# Patient Record
Sex: Female | Born: 1960 | Race: White | Hispanic: No | Smoking: Current every day smoker
Health system: Southern US, Community
[De-identification: ages and names within clinical notes are randomized; demographics above are authoritative.]

## PROBLEM LIST (undated history)

## (undated) DIAGNOSIS — C801 Malignant (primary) neoplasm, unspecified: Secondary | ICD-10-CM

## (undated) DIAGNOSIS — M797 Fibromyalgia: Secondary | ICD-10-CM

## (undated) DIAGNOSIS — F419 Anxiety disorder, unspecified: Secondary | ICD-10-CM

## (undated) DIAGNOSIS — M359 Systemic involvement of connective tissue, unspecified: Secondary | ICD-10-CM

## (undated) DIAGNOSIS — I671 Cerebral aneurysm, nonruptured: Secondary | ICD-10-CM

## (undated) DIAGNOSIS — G8929 Other chronic pain: Secondary | ICD-10-CM

## (undated) DIAGNOSIS — J439 Emphysema, unspecified: Secondary | ICD-10-CM

## (undated) DIAGNOSIS — M549 Dorsalgia, unspecified: Secondary | ICD-10-CM

## (undated) DIAGNOSIS — E785 Hyperlipidemia, unspecified: Secondary | ICD-10-CM

## (undated) DIAGNOSIS — J449 Chronic obstructive pulmonary disease, unspecified: Secondary | ICD-10-CM

## (undated) HISTORY — PX: OOPHORECTOMY: SHX86

## (undated) HISTORY — PX: BACK SURGERY: SHX140

## (undated) HISTORY — PX: APPENDECTOMY: SHX54

## (undated) HISTORY — PX: BRAIN SURGERY: SHX531

## (undated) HISTORY — PX: CARPAL TUNNEL RELEASE: SHX101

## (undated) HISTORY — PX: ABDOMINAL HYSTERECTOMY: SHX81

## (undated) HISTORY — PX: CHOLECYSTECTOMY: SHX55

---

## 2009-07-28 ENCOUNTER — Emergency Department (HOSPITAL_COMMUNITY): Admission: EM | Admit: 2009-07-28 | Discharge: 2009-07-29 | Payer: Self-pay | Admitting: Emergency Medicine

## 2009-12-29 ENCOUNTER — Emergency Department (HOSPITAL_COMMUNITY): Admission: EM | Admit: 2009-12-29 | Discharge: 2009-12-30 | Payer: Self-pay | Admitting: Emergency Medicine

## 2009-12-29 ENCOUNTER — Encounter: Payer: Self-pay | Admitting: Emergency Medicine

## 2010-01-11 ENCOUNTER — Ambulatory Visit (HOSPITAL_COMMUNITY)
Admission: RE | Admit: 2010-01-11 | Discharge: 2010-01-11 | Payer: Self-pay | Source: Home / Self Care | Admitting: Neurosurgery

## 2010-01-26 ENCOUNTER — Inpatient Hospital Stay (HOSPITAL_COMMUNITY): Admission: RE | Admit: 2010-01-26 | Discharge: 2010-01-29 | Payer: Self-pay | Admitting: Neurosurgery

## 2010-05-28 ENCOUNTER — Emergency Department (HOSPITAL_COMMUNITY): Payer: Medicare Other

## 2010-05-28 ENCOUNTER — Observation Stay (HOSPITAL_COMMUNITY)
Admission: RE | Admit: 2010-05-28 | Discharge: 2010-05-29 | Disposition: A | Discharge status: Home / Self Care | Payer: Medicare Other | Source: Ambulatory Visit | Attending: Family Medicine | Admitting: Family Medicine

## 2010-05-28 DIAGNOSIS — E039 Hypothyroidism, unspecified: Secondary | ICD-10-CM | POA: Insufficient documentation

## 2010-05-28 DIAGNOSIS — R1011 Right upper quadrant pain: Secondary | ICD-10-CM | POA: Insufficient documentation

## 2010-05-28 DIAGNOSIS — R0602 Shortness of breath: Secondary | ICD-10-CM | POA: Insufficient documentation

## 2010-05-28 DIAGNOSIS — R0789 Other chest pain: Principal | ICD-10-CM | POA: Insufficient documentation

## 2010-05-28 DIAGNOSIS — E785 Hyperlipidemia, unspecified: Secondary | ICD-10-CM | POA: Insufficient documentation

## 2010-05-28 DIAGNOSIS — IMO0001 Reserved for inherently not codable concepts without codable children: Secondary | ICD-10-CM | POA: Insufficient documentation

## 2010-05-28 DIAGNOSIS — R000 Tachycardia, unspecified: Secondary | ICD-10-CM | POA: Insufficient documentation

## 2010-05-28 DIAGNOSIS — Z79899 Other long term (current) drug therapy: Secondary | ICD-10-CM | POA: Insufficient documentation

## 2010-05-28 LAB — CBC
Hemoglobin: 13.9 g/dL (ref 12.0–15.0)
Platelets: 238 10*3/uL (ref 150–400)
RBC: 4.56 MIL/uL (ref 3.87–5.11)
WBC: 9.1 10*3/uL (ref 4.0–10.5)

## 2010-05-28 LAB — URINALYSIS, ROUTINE W REFLEX MICROSCOPIC
Hgb urine dipstick: NEGATIVE
Nitrite: NEGATIVE
Protein, ur: NEGATIVE mg/dL
Specific Gravity, Urine: 1.015 (ref 1.005–1.030)
Urobilinogen, UA: 0.2 mg/dL (ref 0.0–1.0)

## 2010-05-28 LAB — COMPREHENSIVE METABOLIC PANEL
ALT: 33 U/L (ref 0–35)
AST: 26 U/L (ref 0–37)
CO2: 27 mEq/L (ref 19–32)
Calcium: 9.6 mg/dL (ref 8.4–10.5)
Creatinine, Ser: 0.77 mg/dL (ref 0.4–1.2)
GFR calc Af Amer: >60 mL/min (ref >60)
GFR calc non Af Amer: >60 mL/min (ref >60)
Sodium: 140 mEq/L (ref 135–145)
Total Protein: 7.9 g/dL (ref 6.0–8.3)

## 2010-05-28 LAB — DIFFERENTIAL
Basophils Absolute: 0.1 10*3/uL (ref 0.0–0.1)
Basophils Relative: 1 % (ref 0–1)
Eosinophils Absolute: 0.9 10*3/uL — ABNORMAL HIGH (ref 0.0–0.7)
Neutro Abs: 4.5 10*3/uL (ref 1.7–7.7)
Neutrophils Relative %: 49 % (ref 43–77)

## 2010-05-28 LAB — CK TOTAL AND CKMB (NOT AT ARMC)
Relative Index: INVALID (ref 0.0–2.5)
Total CK: 71 U/L (ref 7–177)

## 2010-05-28 LAB — LIPASE, BLOOD: Lipase: 25 U/L (ref 11–59)

## 2010-05-28 MED ORDER — IOHEXOL 300 MG/ML  SOLN
100.0000 mL | Freq: Once | INTRAMUSCULAR | Status: AC | PRN
  Administered 2010-05-28: 100 mL via INTRAVENOUS

## 2010-05-29 DIAGNOSIS — R072 Precordial pain: Secondary | ICD-10-CM

## 2010-05-29 LAB — CBC
HCT: 39.9 % (ref 36.0–46.0)
Hemoglobin: 13.1 g/dL (ref 12.0–15.0)
MCH: 30.1 pg (ref 26.0–34.0)
MCHC: 32.8 g/dL (ref 30.0–36.0)
MCV: 91.7 fL (ref 78.0–100.0)
RDW: 15.2 % (ref 11.5–15.5)

## 2010-05-29 LAB — DIFFERENTIAL
Eosinophils Relative: 13 % — ABNORMAL HIGH (ref 0–5)
Lymphocytes Relative: 33 % (ref 12–46)
Lymphs Abs: 2.7 10*3/uL (ref 0.7–4.0)
Monocytes Absolute: 0.7 10*3/uL (ref 0.1–1.0)
Monocytes Relative: 9 % (ref 3–12)
Neutro Abs: 3.6 10*3/uL (ref 1.7–7.7)

## 2010-05-29 LAB — COMPREHENSIVE METABOLIC PANEL
AST: 21 U/L (ref 0–37)
Albumin: 3.8 g/dL (ref 3.5–5.2)
BUN: 7 mg/dL (ref 6–23)
Chloride: 104 mEq/L (ref 96–112)
Creatinine, Ser: 0.7 mg/dL (ref 0.4–1.2)
GFR calc Af Amer: >60 mL/min (ref >60)
Total Protein: 6.8 g/dL (ref 6.0–8.3)

## 2010-05-29 LAB — CARDIAC PANEL(CRET KIN+CKTOT+MB+TROPI)
CK, MB: 1.2 ng/mL (ref 0.3–4.0)
Relative Index: INVALID (ref 0.0–2.5)
Total CK: 65 U/L (ref 7–177)
Troponin I: 0.01 ng/mL (ref 0.00–0.06)

## 2010-05-29 LAB — TSH: TSH: 2.325 u[IU]/mL (ref 0.350–4.500)

## 2010-05-29 LAB — LIPID PANEL
HDL: 19 mg/dL — ABNORMAL LOW (ref >39)
Triglycerides: 443 mg/dL — ABNORMAL HIGH (ref <150)
VLDL: UNDETERMINED mg/dL (ref 0–40)

## 2010-05-29 LAB — BRAIN NATRIURETIC PEPTIDE: Pro B Natriuretic peptide (BNP): <30 pg/mL (ref 0.0–100.0)

## 2010-05-29 LAB — D-DIMER, QUANTITATIVE: D-Dimer, Quant: 0.34 ug/mL-FEU (ref 0.00–0.48)

## 2010-05-30 ENCOUNTER — Encounter (HOSPITAL_COMMUNITY): Payer: Self-pay

## 2010-05-30 ENCOUNTER — Encounter (HOSPITAL_COMMUNITY)
Admit: 2010-05-30 | Discharge: 2010-05-30 | Disposition: A | Discharge status: Home / Self Care | Payer: Medicare Other | Source: Ambulatory Visit | Attending: Family Medicine | Admitting: Family Medicine

## 2010-05-30 DIAGNOSIS — R932 Abnormal findings on diagnostic imaging of liver and biliary tract: Secondary | ICD-10-CM | POA: Insufficient documentation

## 2010-05-30 DIAGNOSIS — R109 Unspecified abdominal pain: Secondary | ICD-10-CM | POA: Insufficient documentation

## 2010-05-30 HISTORY — DX: Malignant (primary) neoplasm, unspecified: C80.1

## 2010-05-30 HISTORY — DX: Systemic involvement of connective tissue, unspecified: M35.9

## 2010-05-30 MED ORDER — TECHNETIUM TC 99M MEBROFENIN IV KIT
5.0000 | PACK | Freq: Once | INTRAVENOUS | Status: AC | PRN
  Administered 2010-05-30: 5 via INTRAVENOUS

## 2010-06-01 NOTE — Discharge Summary (Signed)
  NAMECLARINDA, OBI              ACCOUNT NO.:  [id number]  MEDICAL RECORD NO.:  [id number]           PATIENT TYPE:  I  LOCATION:  A303                          FACILITY:  [facility]  PHYSICIAN:  Tarry Kos, MD       DATE OF BIRTH:  [birth year]  DATE OF ADMISSION:  05/28/2010 DATE OF DISCHARGE:  02/28/2012LH                              DISCHARGE SUMMARY   DISCHARGE DIAGNOSES: 1. Atypical chest pain. 2. Right upper quadrant abdominal pain.  SUMMARY HOSPITAL COURSE:  Ms. Kirsten Montgomery is a 50 year old female who presented to the emergency room last night with atypical chest pain/right upper quadrant abdominal pain.  She was admitted for ACS. She had serial cardiac enzymes which were all negative.  Her LFTs were normal.  Her total bili was normal.  Alk phos was also normal.  Cardiac enzymes were all negative.  She had a CT of her abdomen and pelvis which showed diffuse hepatic steatosis, 3.7 cm cystic lesion in the left adnexa.  I recommend followup ultrasound in 6 weeks.  She will need to have this followed up with her gynecologist or primary care physician. I have scheduled for her to get a HIDA scan, it sounds like she has been having biliary colic for several months now.  I am going to schedule HIDA scan, it is going to be done tomorrow. This will need to be sent to her primary care physician, Dr. Loney Hering.  She had a 2-D echo done today also that final report is pending.  She is being discharged home to continue her home medication regimen, and the only thing that I have added is aspirin at follow up probably with her primary care physician in 1 week.  I would recommend following up on the HIDA scan and a 2-D echo that was done and further outpatient cardiac stress testing if warranted.  Please see admit H and P for full details.                                           ______________________________ Tarry Kos, MD     RD/MEDQ  D:  05/29/2010  T:  05/30/2010  Job:   [id number]  Electronically Signed by Eldridge Dace MD on 06/01/2010 02:10:57 PM

## 2010-06-08 NOTE — Discharge Summary (Signed)
  NAMEHONG, TIMM              ACCOUNT NO.:  [id number]  MEDICAL RECORD NO.:  [id number]          PATIENT TYPE:  INP  LOCATION:  3008                         FACILITY:  [facility]  PHYSICIAN:  Danae Orleans. [name], M.D.  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  HISTORY OF ILLNESS AND HOSPITAL COURSE:  Kirsten Montgomery was found to have an incidental left 8-mm middle cerebral artery aneurysm.  It was elected to admit her to the hospital on the same day's procedure basis for her to undergo craniotomy and clipping of aneurysm.  The patient did well with this surgery, was observed in the ICU, had no complications, was doing well on January 30, 2011, and was discharged home with instructions to follow up in 10 days for staple removal.  Discharge medications included Percocet.  Final diagnosis was same and condition was improved.  Her discharge medications include: 1. Diazepam 10 mg 3 times daily and nightly, these are preoperative     medications. 2. Additionally, tramadol 50 mg 2 tablets b.i.d. and nightly p.r.n. 3. Lorazepam 1 mg p.o. q.i.d. 4. Gabapentin 600 mg 3 times daily. 5. Doxycycline 100 mg twice daily. 6. Lipitor 20 mg daily. 7. Liothyronine 5 mcg 2 tablets every evening. 8. Cyclobenzaprine 10 mg 2 tablets every evening. 9. Amitriptyline 50 mg nightly. 10.Zolpidem 10 mg nightly.  Additional diagnoses include: 1. Anxiety disorder. 2. Chronic back pain. 3. Sleep disorder.     Danae Orleans. [name], M.D.     JDS/MEDQ  D:  05/24/2010  T:  05/24/2010  Job:  [id number]  Electronically Signed by Maeola Harman M.D. on 06/08/2010 12:13:30 PM

## 2010-06-13 LAB — BASIC METABOLIC PANEL
BUN: 7 mg/dL (ref 6–23)
Calcium: 9.8 mg/dL (ref 8.4–10.5)
GFR calc Af Amer: >60 mL/min (ref >60)
Potassium: 4.8 mEq/L (ref 3.5–5.1)

## 2010-06-13 LAB — TYPE AND SCREEN: Unit division: 0

## 2010-06-13 LAB — CBC
HCT: 47.7 % — ABNORMAL HIGH (ref 36.0–46.0)
Hemoglobin: 16.4 g/dL — ABNORMAL HIGH (ref 12.0–15.0)
MCH: 30.3 pg (ref 26.0–34.0)
MCHC: 34.4 g/dL (ref 30.0–36.0)
MCV: 88.2 fL (ref 78.0–100.0)

## 2010-06-13 LAB — SURGICAL PCR SCREEN: Staphylococcus aureus: NEGATIVE

## 2010-06-14 LAB — BASIC METABOLIC PANEL
BUN: 7 mg/dL (ref 6–23)
CO2: 31 mEq/L (ref 19–32)
Calcium: 9.4 mg/dL (ref 8.4–10.5)
Calcium: 9 mg/dL (ref 8.4–10.5)
Chloride: 103 mEq/L (ref 96–112)
Creatinine, Ser: 0.79 mg/dL (ref 0.4–1.2)
Creatinine, Ser: 0.71 mg/dL (ref 0.4–1.2)
GFR calc Af Amer: >60 mL/min (ref >60)
GFR calc Af Amer: >60 mL/min (ref >60)
GFR calc non Af Amer: >60 mL/min (ref >60)
Sodium: 137 mEq/L (ref 135–145)

## 2010-06-14 LAB — CSF CELL COUNT WITH DIFFERENTIAL
RBC Count, CSF: 9955 /mm3 — ABNORMAL HIGH
Tube #: 1
WBC, CSF: 1 /mm3 (ref 0–5)
WBC, CSF: 2 /mm3 (ref 0–5)

## 2010-06-14 LAB — CSF CULTURE W GRAM STAIN: Culture: NO GROWTH

## 2010-06-14 LAB — CBC
Hemoglobin: 15.3 g/dL — ABNORMAL HIGH (ref 12.0–15.0)
MCH: 30.9 pg (ref 26.0–34.0)
MCHC: 34.5 g/dL (ref 30.0–36.0)
MCHC: 34.7 g/dL (ref 30.0–36.0)
MCV: 89.1 fL (ref 78.0–100.0)
Platelets: 274 10*3/uL (ref 150–400)
Platelets: 218 10*3/uL (ref 150–400)
RBC: 5.04 MIL/uL (ref 3.87–5.11)
RBC: 4.26 MIL/uL (ref 3.87–5.11)
RDW: 13.7 % (ref 11.5–15.5)

## 2010-06-14 LAB — URINALYSIS, ROUTINE W REFLEX MICROSCOPIC
Glucose, UA: NEGATIVE mg/dL
Hgb urine dipstick: NEGATIVE
Ketones, ur: NEGATIVE mg/dL
Protein, ur: NEGATIVE mg/dL
Urobilinogen, UA: 0.2 mg/dL (ref 0.0–1.0)

## 2010-06-14 LAB — PROTEIN, CSF: Total  Protein, CSF: 87 mg/dL — ABNORMAL HIGH (ref 15–45)

## 2010-06-14 LAB — DIFFERENTIAL
Basophils Absolute: 0.1 10*3/uL (ref 0.0–0.1)
Basophils Relative: 1 % (ref 0–1)
Eosinophils Absolute: 0.8 10*3/uL — ABNORMAL HIGH (ref 0.0–0.7)
Eosinophils Relative: 12 % — ABNORMAL HIGH (ref 0–5)
Neutrophils Relative %: 45 % (ref 43–77)

## 2010-06-14 LAB — POCT CARDIAC MARKERS: Myoglobin, poc: 63.7 ng/mL (ref 12–200)

## 2010-06-14 LAB — PROTIME-INR
INR: 1.01 (ref 0.00–1.49)
INR: 0.94 (ref 0.00–1.49)
Prothrombin Time: 13.5 seconds (ref 11.6–15.2)
Prothrombin Time: 12.8 seconds (ref 11.6–15.2)

## 2010-06-14 LAB — GRAM STAIN

## 2010-06-19 LAB — URINALYSIS, ROUTINE W REFLEX MICROSCOPIC
Glucose, UA: 100 mg/dL — AB
Nitrite: NEGATIVE
Specific Gravity, Urine: 1.025 (ref 1.005–1.030)
pH: 5 (ref 5.0–8.0)

## 2010-07-02 NOTE — H&P (Signed)
Kirsten Montgomery, Kirsten Montgomery              ACCOUNT NO.:  [id number]  MEDICAL RECORD NO.:  [id number]           PATIENT TYPE:  E  LOCATION:  [facility]                          FACILITY:  [facility]  PHYSICIAN:  Eduard Clos, MDDATE OF BIRTH:  1961/01/10  DATE OF ADMISSION:  05/29/2010 DATE OF DISCHARGE:  LH                             HISTORY & PHYSICAL   PRIMARY CARE PHYSICIAN:  Dr. Loney Hering.  CHIEF COMPLAINT:  Chest pain.  HISTORY OF PRESENT ILLNESS:  This is a 50 year old female with history of hyperlipemia, hypothyroidism, depression, fibromyalgia, and previous history of cervical cancer and ovarian CA status post surgery who presents complaining of chest pain.  The patient she has been having chest pain almost for a week or two which is more on the right side, sometimes radiating to her neck and right shoulder.  It has no relation to exertion, but does get short of breath.  Denies any palpitation. Denies any diaphoresis.  Denies any nausea or vomiting.  The patient says chest pain lasts for about 1-2 minutes.  It happens multiple times in a day.  In addition, she had been experiencing some abdominal pain which radiates from the epigastric down to her groin.  At this time, CT of the abdomen and pelvis does not show any acute.  The patient has been admitted for further workup.  The patient denies any dizziness, loss of consciousness, or any focal deficit.  Denies any fever or chills.  Does have some cough, not productive.  PAST MEDICAL HISTORY: 1. History of hyperlipidemia. 2. Hypothyroidism. 3. Osteoarthritis. 4. History of CA of the cervix and ovary. 5. History of depression and anxiety. 6. Chronic pain.  PAST SURGICAL HISTORY:  Aneurysm clipping in the brain, appendectomy, C- section, hysterectomy, and tubal ligation.  MEDICATIONS ON ADMISSION:  These have to be verified and include: 1. Diazepam 10 mg 2 times daily. 2. Tramadol p.r.n. 3. Lorazepam 1 mg 4 times daily p.r.n. 4.  Lipitor 20 mg at bedtime. 5. Liothyronine 10 mcg daily. 6. Cyclobenzaprine 10 mg daily p.r.n. 7. Amitriptyline 50 mg at bedtime.  ALLERGIES: 1. DARVOCET. 2. DEMEROL. 3. CODEINE.  FAMILY HISTORY:  Significant for coronary artery disease in her dad and myeloma in her dad.  SOCIAL HISTORY:  The patient smokes cigarettes, has been advised to quit smoking.  Drinks alcohol occasionally.  Denies any drug abuse.  Lives with her daughter.  REVIEW OF SYSTEMS:  As per the history of present illness, nothing else significant.  PHYSICAL EXAMINATION:  GENERAL:  The patient examined at bedside, not in acute distress. VITAL SIGNS:  Blood pressure 120/70.  Pulse is 102 per minute, sinus tachy.  Temperature 98.2.  Respirations 18 per minute.  O2 sat 98%. HEENT:  Anicteric.  No pallor.  No discharge from ears, eyes, nose, or mouth. CHEST:  Bilateral air entry present.  No rhonchi.  No crepitation. HEART:  S1 and S2 heard. ABDOMEN:  Soft and nontender.  Bowel sounds heard. CENTRAL NERVOUS SYSTEM:  The patient is alert, wake, and oriented to time, place, and person.  Moves upper and lower extremities 5/5. EXTREMITIES:  Peripheral pulses are  felt.  No edema.  LABORATORY DATA:  EKG shows sinus rhythm with nonspecific ST-T changes. Heart rate is around 101 beats per minute.  CBC:  WBC 9.1, hemoglobin 13.9, hematocrit 40.9, platelets 238, eosinophils 10%.  Complete metabolic panel:  Sodium 140, potassium 3.9, chloride 102, carbon dioxide 27, glucose 87, BUN 10, creatinine 0.7, total bilirubin is 0.4, alk phos 76, AST 26, ALT 33, total protein 7.9, albumin 4.1, calcium 9.6, lipase 25.  CK is 71 and MB is 1.  CT of abdomen and pelvis with contrast media shows diffuse hepatic steatosis, 3.7-cm cystic lesion in the left adnexa.  Followup ultrasound in 6 weeks is recommended to ensure resolution.  L5-S1 spondylolisthesis.  Chest x-ray shows nothing acute.  ASSESSMENT: 1. Chest pain, atypical at this  time, rule out acute coronary     syndrome. 2. Sinus tachycardia. 3. Nonspecific abdominal pain. 4. Cystic lesion in the left adnexa which will need further workup as     outpatient. 5. History of hypothyroidism. 6. History of hyperlipidemia. 7. History of cervical cancer and ovarian cancer. 8. Ongoing tobacco abuse disorder.  PLAN: 1. At this time, we will admit the patient to telemetry. 2. For chest pain, we will cycle cardiac markers.  The patient will be     on aspirin.  We will place the patient on p.r.n. nitroglycerin.  We     will get a 2-D echo. 3. Sinus tachycardia.  At this time, we are going to order a D-dimer.     If D-dimer is high, then we will need to rule out PE with CT angio     of chest. 4. We need to verify her home medication and start whichever is     appropriate. 5. The patient will need tobacco cessation counseling and further     recommendation based on test orders and clinical course.     Eduard Clos, MD     ANK/MEDQ  D:  05/29/2010  T:  05/29/2010  Job:  [id number]  cc:   Dr. Loney Hering  Electronically Signed by Midge Minium MD on 07/02/2010 07:17:25 AM

## 2010-07-03 ENCOUNTER — Other Ambulatory Visit: Payer: Self-pay | Admitting: Neurosurgery

## 2010-07-03 DIAGNOSIS — M545 Low back pain: Secondary | ICD-10-CM

## 2010-07-06 ENCOUNTER — Ambulatory Visit
Admission: RE | Admit: 2010-07-06 | Discharge: 2010-07-06 | Disposition: A | Discharge status: Home / Self Care | Payer: Medicare Other | Source: Ambulatory Visit | Attending: Neurosurgery | Admitting: Neurosurgery

## 2010-07-06 DIAGNOSIS — M545 Low back pain: Secondary | ICD-10-CM

## 2010-08-02 ENCOUNTER — Other Ambulatory Visit (HOSPITAL_COMMUNITY): Payer: Self-pay | Admitting: Neurosurgery

## 2010-08-02 ENCOUNTER — Ambulatory Visit (HOSPITAL_COMMUNITY)
Admission: RE | Admit: 2010-08-02 | Discharge: 2010-08-02 | Disposition: A | Discharge status: Home / Self Care | Payer: Medicare Other | Source: Ambulatory Visit | Attending: Neurosurgery | Admitting: Neurosurgery

## 2010-08-02 ENCOUNTER — Encounter (HOSPITAL_COMMUNITY)
Admission: RE | Admit: 2010-08-02 | Discharge: 2010-08-02 | Disposition: A | Discharge status: Home / Self Care | Payer: Medicare Other | Source: Ambulatory Visit | Attending: Neurosurgery | Admitting: Neurosurgery

## 2010-08-02 DIAGNOSIS — Z01812 Encounter for preprocedural laboratory examination: Secondary | ICD-10-CM | POA: Insufficient documentation

## 2010-08-02 DIAGNOSIS — Z01818 Encounter for other preprocedural examination: Secondary | ICD-10-CM | POA: Insufficient documentation

## 2010-08-02 DIAGNOSIS — M5126 Other intervertebral disc displacement, lumbar region: Secondary | ICD-10-CM

## 2010-08-02 DIAGNOSIS — M5137 Other intervertebral disc degeneration, lumbosacral region: Secondary | ICD-10-CM | POA: Insufficient documentation

## 2010-08-02 DIAGNOSIS — Z01810 Encounter for preprocedural cardiovascular examination: Secondary | ICD-10-CM | POA: Insufficient documentation

## 2010-08-02 DIAGNOSIS — M51379 Other intervertebral disc degeneration, lumbosacral region without mention of lumbar back pain or lower extremity pain: Secondary | ICD-10-CM | POA: Insufficient documentation

## 2010-08-02 LAB — CBC
MCH: 30.1 pg (ref 26.0–34.0)
MCHC: 33.8 g/dL (ref 30.0–36.0)
MCV: 89.1 fL (ref 78.0–100.0)
Platelets: 218 10*3/uL (ref 150–400)
RDW: 13.5 % (ref 11.5–15.5)
WBC: 9.3 10*3/uL (ref 4.0–10.5)

## 2010-08-02 LAB — BASIC METABOLIC PANEL
BUN: 16 mg/dL (ref 6–23)
Calcium: 9.7 mg/dL (ref 8.4–10.5)
Creatinine, Ser: 0.76 mg/dL (ref 0.4–1.2)
GFR calc non Af Amer: >60 mL/min (ref >60)

## 2010-08-02 LAB — SURGICAL PCR SCREEN: Staphylococcus aureus: NEGATIVE

## 2010-08-10 ENCOUNTER — Inpatient Hospital Stay (HOSPITAL_COMMUNITY): Payer: Medicare Other

## 2010-08-10 ENCOUNTER — Inpatient Hospital Stay (HOSPITAL_COMMUNITY)
Admission: RE | Admit: 2010-08-10 | Discharge: 2010-08-13 | DRG: 460 | Disposition: A | Discharge status: Home Health | Payer: Medicare Other | Source: Ambulatory Visit | Attending: Neurosurgery | Admitting: Neurosurgery

## 2010-08-10 DIAGNOSIS — K219 Gastro-esophageal reflux disease without esophagitis: Secondary | ICD-10-CM | POA: Diagnosis present

## 2010-08-10 DIAGNOSIS — K449 Diaphragmatic hernia without obstruction or gangrene: Secondary | ICD-10-CM | POA: Diagnosis present

## 2010-08-10 DIAGNOSIS — M5126 Other intervertebral disc displacement, lumbar region: Secondary | ICD-10-CM | POA: Diagnosis present

## 2010-08-10 DIAGNOSIS — F17200 Nicotine dependence, unspecified, uncomplicated: Secondary | ICD-10-CM | POA: Diagnosis present

## 2010-08-10 DIAGNOSIS — M47817 Spondylosis without myelopathy or radiculopathy, lumbosacral region: Principal | ICD-10-CM | POA: Diagnosis present

## 2010-08-10 DIAGNOSIS — M51379 Other intervertebral disc degeneration, lumbosacral region without mention of lumbar back pain or lower extremity pain: Secondary | ICD-10-CM | POA: Diagnosis present

## 2010-08-10 DIAGNOSIS — M5137 Other intervertebral disc degeneration, lumbosacral region: Secondary | ICD-10-CM | POA: Diagnosis present

## 2010-08-10 LAB — TYPE AND SCREEN
ABO/RH(D): A POS
Antibody Screen: NEGATIVE

## 2010-08-14 NOTE — Op Note (Signed)
Kirsten Montgomery, CHEEK              ACCOUNT NO.:  [id number]  MEDICAL RECORD NO.:  [id number]           PATIENT TYPE:  I  LOCATION:  3001                         FACILITY:  [facility]  PHYSICIAN:  Danae Orleans. [name], M.D.  DATE OF BIRTH:  [birth year]  DATE OF PROCEDURE:  08/10/2010 DATE OF DISCHARGE:                              OPERATIVE REPORT   PREOPERATIVE DIAGNOSIS:  L5 spondylolysis, L4-5 and L5-S1 spondylolisthesis, herniated lumbar disk stenosis, degenerative disk disease and radiculopathy.  PROCEDURE: 1. L5 Gill procedure. 2. L4 Gill procedure. 3. L4-5 and L5-S1 posterior lumbar interbody fusion with PEEK     interbody cages, morselized bone autograft, PureGen and allograft. 4. Segmental L4-S1 pedicle screw fixation. 5. Posterolateral arthrodesis with autograft and allograft.  SURGEON:  Danae Orleans. [name], MD  ASSISTANTS:  Georgiann Cocker, RN and Coletta Memos, MD  ANESTHESIA:  General endotracheal anesthesia.  ESTIMATED BLOOD LOSS:  500 mL with 200 mL of Cell Saver blood returned to the patient.  COMPLICATIONS:  None.  DISPOSITION:  Recovery.  INDICATIONS:  Kirsten Montgomery is a 50 year old woman with a mobile spondylolisthesis of L5 and S1 with spondylolysis of L5.  She has retrolisthesis of L4 and L5 with a calcified central disk herniation at this level.  It was elected to take her to surgery for decompression and fusion at these affected levels.  PROCEDURE:  Ms. [name] was brought to the operating room.  Following satisfactory and uncomplicated induction of general endotracheal anesthesia plus intravenous lines and Foley catheter, the patient was placed in prone position on [place] table.  Her low back was injected with local lidocaine.  Incision was made and carried to the lumbodorsal fascia which was incised bilaterally.  Subperiosteal dissection was performed exposing the L5 transverse processes and the sacral alae were exposed.  L5 posterior elements were  hypermobile clearly disrupted with bilateral pars fractures.  After confirmatory radiograph was obtained, a total laminectomy and removal of the posterior elements of L5 was performed decompressing the thecal sac in L5 and S1 nerve roots.  A similar decompression was performed at L4 with disarticulation of the posterior elements leaving a small rim of superior aspect of L4 and the thecal sac and L4 nerve roots were also decompressed.  Thorough diskectomy was then performed bilaterally with removal of cephalad migrated herniated disk material behind the anterolisthesed body of L5. After preparation of the endplates a thorough diskectomy at both the L4- 5, L5-S1 levels it was elected to use 9-mm PEEK interbody cages which were packed with PureGen soaked profuse blocks along with approximately 6 mL of autograft which were consistent with morselized posterior elements.  These were tamped into the interspace and countersunk and then subsequently PLIF cages were placed and countersunk appropriately. A similar placement of approximately 6 mL of autograft at the L4-5 level was performed.  This was tamped into position and then similarly packed, 10-mm PEEK cages were placed at this level.  It was countersunk appropriately.  Marker probes were then placed  for pedicle screws at L4, L5 and S1 bilaterally and intraoperative radiograph with AP and lateral fluoroscopy was then performed.  The 6.5  x 35 mm sacral screws were placed, and 5.5 x 40 mm screws were placed at L4-L5.  All screws had excellent purchase and their position was confirmed on AP and lateral fluoroscopy.  The posterolateral region was extensively decorticated and then packed with the remaining 6 mL of autograft and 10 mL of Vitoss which was reconstituted with the patient's blood.  50-mm preloaded rods were affixed to the screw heads and locked down in situ. Neural elements were inspected and found to be in good repair. Hemostasis was  assured.  The self-retaining retractor was removed.  The lumbodorsal fascia was closed with 1-0 Vicryl suture, subcutaneous tissues were reapproximated with 2-0 Vicryl interrupted inverted sutures and skin edges reapproximated with 3-0 Vicryl subcuticular stitch.  The wound was dressed with Benzoin, Steri-Strips, Telfa gauze and tape.  The patient was extubated in the operating room and taken to the recovery room in stable satisfactory condition having tolerated the operation well.  Counts were correct at the end of the case.     Danae Orleans. [name], M.D.     JDS/MEDQ  D:  08/10/2010  T:  08/11/2010  Job:  [id number]  Electronically Signed by Maeola Harman M.D. on 08/14/2010 07:44:37 AM

## 2010-08-28 NOTE — Discharge Summary (Signed)
  NAMEJOYCE, HEITMAN              ACCOUNT NO.:  [id number]  MEDICAL RECORD NO.:  [id number]           PATIENT TYPE:  I  LOCATION:  3001                         FACILITY:  [facility]  PHYSICIAN:  Danae Orleans. [name], M.D.  DATE OF BIRTH:  1960/07/09  DATE OF ADMISSION:  08/10/2010 DATE OF DISCHARGE:  08/13/2010                              DISCHARGE SUMMARY   REASON FOR ADMISSION:  L5 spondylolysis, L4-5 and L5-S1 spondylolisthesis, herniated lumbar disk stenosis, degenerative disk disease, and radiculopathy.  FINAL DIAGNOSES: 1. L5 spondylolysis. 2. L4-5 and L5-S1 spondylolisthesis. 3. Herniated lumbar disk stenosis. 4. Degenerative disk disease and radiculopathy. 5. Status post aneurysm clipping due to middle cerebral artery     aneurysm. 6. Tobacco use disorder.  ADDITIONAL MEDICAL DIAGNOSES: 1. Hypothyroidism. 2. Mood disturbance. 3. Depression.  PREOPERATIVE MEDICATIONS: 1. Liothyronine 10 mcg 2 tablets daily. 2. Artificial tears. 3. Hydrocodone 10/325 one tablet every 6-8 hours. 4. Abilify 15 mg daily. 5. Meloxicam, which was held on discharge. 6. Aspirin, which was also held on discharge. 7. Amitriptyline 50 mg daily. 8. Cyclobenzaprine 10 mg 2 tablets at bedtime. 9. Lipitor 20 mg daily. 10.Lorazepam 2 mg daily at bedtime.  HOSPITAL COURSE:  Ms. [name] was taken to surgery and underwent uncomplicated decompression and fusion of L4 through sacral levels.  She was mobilized with physical therapy.  She did have some tachycardia postoperatively which was felt secondary to anxiety.  She was doing well, on May 14th was mobilizing without difficulty, was discharged home with home health services in a back brace, instructed to walk, progressively ambulate.  No complications of operation or hospitalization.  The patient was instructed to follow up in the office 3 weeks postoperatively with lumbar radiographs.     Danae Orleans. [name], M.D.     JDS/MEDQ  D:  08/27/2010   T:  08/27/2010  Job:  [id number]  Electronically Signed by Maeola Harman M.D. on 08/28/2010 01:27:03 PM

## 2011-11-20 ENCOUNTER — Other Ambulatory Visit: Payer: Self-pay | Admitting: Neurosurgery

## 2011-11-20 ENCOUNTER — Other Ambulatory Visit (HOSPITAL_COMMUNITY): Payer: Self-pay | Admitting: Neurosurgery

## 2011-11-20 DIAGNOSIS — M47816 Spondylosis without myelopathy or radiculopathy, lumbar region: Secondary | ICD-10-CM

## 2011-11-27 ENCOUNTER — Other Ambulatory Visit (HOSPITAL_COMMUNITY): Payer: Medicare Other

## 2011-12-18 ENCOUNTER — Ambulatory Visit (HOSPITAL_COMMUNITY)
Admission: RE | Admit: 2011-12-18 | Discharge: 2011-12-18 | Disposition: A | Discharge status: Home / Self Care | Payer: Medicare Other | Source: Ambulatory Visit | Attending: Neurosurgery | Admitting: Neurosurgery

## 2011-12-18 DIAGNOSIS — Z981 Arthrodesis status: Secondary | ICD-10-CM | POA: Insufficient documentation

## 2011-12-18 DIAGNOSIS — M51379 Other intervertebral disc degeneration, lumbosacral region without mention of lumbar back pain or lower extremity pain: Secondary | ICD-10-CM | POA: Insufficient documentation

## 2011-12-18 DIAGNOSIS — M5137 Other intervertebral disc degeneration, lumbosacral region: Secondary | ICD-10-CM | POA: Insufficient documentation

## 2011-12-18 DIAGNOSIS — M47816 Spondylosis without myelopathy or radiculopathy, lumbar region: Secondary | ICD-10-CM

## 2011-12-18 MED ORDER — IOHEXOL 180 MG/ML  SOLN
20.0000 mL | Freq: Once | INTRAMUSCULAR | Status: AC | PRN
Start: 2011-12-18 — End: 2011-12-18
  Administered 2011-12-18: 20 mL via INTRATHECAL

## 2011-12-18 MED ORDER — HYDROCODONE-ACETAMINOPHEN 5-325 MG PO TABS
1.0000 | ORAL_TABLET | ORAL | Status: DC | PRN
  Administered 2011-12-18: 1 via ORAL
  Filled 2011-12-18: qty 1

## 2011-12-18 MED ORDER — ONDANSETRON HCL 4 MG/2ML IJ SOLN: 4.0000 mg | Freq: Four times a day (QID) | INTRAMUSCULAR | Status: DC | PRN

## 2011-12-18 MED ORDER — DIAZEPAM 5 MG PO TABS
ORAL_TABLET | ORAL | Status: AC
  Administered 2011-12-18: 10 mg via ORAL
  Filled 2011-12-18: qty 2

## 2011-12-18 MED ORDER — DIAZEPAM 5 MG PO TABS
10.0000 mg | ORAL_TABLET | Freq: Once | ORAL | Status: AC
  Administered 2011-12-18: 10 mg via ORAL

## 2011-12-18 NOTE — Procedures (Signed)
Omnipaque 180 L2/3

## 2012-01-04 IMAGING — NM NM HEPATO W/GB/PHARM/[PERSON_NAME]
2 series · 12 of 12 positions shown · non-contrast
Comparison: CT 05/28/2010

CLINICAL DATA: Abdominal pain

NUCLEAR MEDICINE HEPATOBILIARY IMAGING WITH GALLBLADDER EF
TECHNIQUE: Sequential images of the abdomen were obtained [DATE]
minutes following intravenous administration of
radiopharmaceutical.  After slow intravenous infusion of 4.44ucg
Cholecystokinin, gallbladder ejection fraction was determined.
Radiopharmaceutical:  P.2mXi Jc-55m Choletec

[Series 1: hida · 3.20mm/px · 6 of 30 frames shown (1 of 2)]
[frame 3/30]
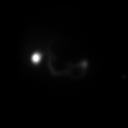
[frame 8/30]
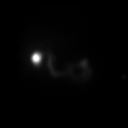
[frame 13/30]
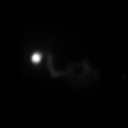
[frame 18/30]
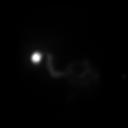
[frame 23/30]
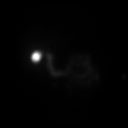
[frame 28/30]
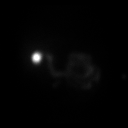

[Series 1: hida · 3.20mm/px · 6 of 60 frames shown (2 of 2)]
[frame 6/60]
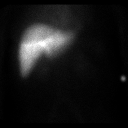
[frame 16/60]
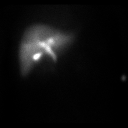
[frame 26/60]
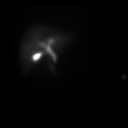
[frame 36/60]
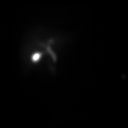
[frame 46/60]
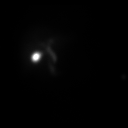
[frame 56/60]
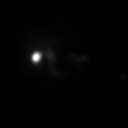

[12 of 12 positions shown; findings below may reference images not displayed]

FINDINGS: There is prompt uptake and excretion of radiotracer by
the liver.  No evidence of cystic duct or common duct obstruction.
Gallbladder ejection fraction is 16%.  Normal is greater than 30%.

The patient did not experience symptoms during CCK infusion.
IMPRESSION: Abnormally low gallbladder ejection fraction suggesting dyskinesia.

## 2012-03-31 ENCOUNTER — Encounter (HOSPITAL_COMMUNITY): Payer: Self-pay | Admitting: *Deleted

## 2012-03-31 ENCOUNTER — Emergency Department (HOSPITAL_COMMUNITY)
Admission: EM | Admit: 2012-03-31 | Discharge: 2012-03-31 | Disposition: A | Discharge status: Home / Self Care | Payer: Medicare Other | Attending: Emergency Medicine | Admitting: Emergency Medicine

## 2012-03-31 DIAGNOSIS — Z859 Personal history of malignant neoplasm, unspecified: Secondary | ICD-10-CM | POA: Insufficient documentation

## 2012-03-31 DIAGNOSIS — J040 Acute laryngitis: Secondary | ICD-10-CM | POA: Insufficient documentation

## 2012-03-31 DIAGNOSIS — F17200 Nicotine dependence, unspecified, uncomplicated: Secondary | ICD-10-CM | POA: Insufficient documentation

## 2012-03-31 DIAGNOSIS — J029 Acute pharyngitis, unspecified: Secondary | ICD-10-CM | POA: Insufficient documentation

## 2012-03-31 DIAGNOSIS — Z7982 Long term (current) use of aspirin: Secondary | ICD-10-CM | POA: Insufficient documentation

## 2012-03-31 DIAGNOSIS — Z79899 Other long term (current) drug therapy: Secondary | ICD-10-CM | POA: Insufficient documentation

## 2012-03-31 DIAGNOSIS — Z8679 Personal history of other diseases of the circulatory system: Secondary | ICD-10-CM | POA: Insufficient documentation

## 2012-03-31 HISTORY — DX: Cerebral aneurysm, nonruptured: I67.1

## 2012-03-31 MED ORDER — LIDOCAINE VISCOUS 2 % MT SOLN: OROMUCOSAL | Status: DC

## 2012-03-31 NOTE — ED Provider Notes (Signed)
History     CSN: [id number]  Arrival date & time 03/31/12  1019   First MD Initiated Contact with Patient 03/31/12 1042      Chief Complaint  Patient presents with  . Rash    (Consider location/radiation/quality/duration/timing/severity/associated sxs/prior treatment) HPI Comments: Notes "rash on tongue" with pain and sore throat x 1 week.  No fever or chills.  Also, notes laryngitic sxs since yest.  Patient is a 51 y.o. female presenting with rash. The history is provided by the patient. No language interpreter was used.  Rash  This is a new problem. Episode onset: 1 week ago. The problem has not changed since onset.There has been no fever. The pain is moderate. Associated symptoms include pain. Pertinent negatives include no blisters, no itching and no weeping. She has tried nothing for the symptoms.    Past Medical History  Diagnosis Date  . Cancer   . Collagen vascular disease   . Brain aneurysm     Past Surgical History  Procedure Date  . Brain surgery   . Back surgery   . Cholecystectomy   . Oophorectomy     ? left     No family history on file.  History  Substance Use Topics  . Smoking status: Current Every Day Smoker    Types: Cigarettes  . Smokeless tobacco: Not on file  . Alcohol Use: No    OB History    Grav Para Term Preterm Abortions TAB SAB Ect Mult Living                  Review of Systems  Constitutional: Negative for fever and chills.  HENT: Positive for sore throat and voice change.   Respiratory: Negative for cough.   Skin: Positive for rash. Negative for itching.  All other systems reviewed and are negative.    Allergies  Codeine; Cymbalta; Darvocet; and Demerol  Home Medications   Current Outpatient Rx  Name  Route  Sig  Dispense  Refill  . ALPRAZOLAM 1 MG PO TABS   Oral   Take 1 mg by mouth 3 (three) times daily as needed. For anxiety         . AMITRIPTYLINE HCL 50 MG PO TABS   Oral   Take 50 mg by mouth at  bedtime.         . ASPIRIN 325 MG PO TBEC   Oral   Take 325 mg by mouth daily.         . ATORVASTATIN CALCIUM 20 MG PO TABS   Oral   Take 20 mg by mouth daily.         Marland Kitchen DICLOFENAC SODIUM 75 MG PO TBEC   Oral   Take 75 mg by mouth 2 (two) times daily.         Marland Kitchen HYDROCODONE-ACETAMINOPHEN 5-325 MG PO TABS   Oral   Take 1 tablet by mouth every 6 (six) hours as needed. For back pain         . LIOTHYRONINE SODIUM 5 MCG PO TABS   Oral   Take 10 mcg by mouth daily.          Marland Kitchen TIZANIDINE HCL 4 MG PO CAPS   Oral   Take 4-8 mg by mouth 2 (two) times daily. 1 in the morning and 2 at night         . TRAZODONE HCL 100 MG PO TABS   Oral   Take 200 mg by mouth at  bedtime.         Marland Kitchen LIDOCAINE VISCOUS 2 % MT SOLN      Swish 5 ml  lidocaine around mouth and throat prn pain   150 mL   0     BP 135/82  Pulse 98  Temp 97.4 F (36.3 C) (Oral)  Resp 20  Ht 5\' 2"  (1.575 m)  Wt 120 lb (54.432 kg)  BMI 21.95 kg/m2  SpO2 99%  Physical Exam  Nursing note and vitals reviewed. Constitutional: She is oriented to person, place, and time. She appears well-developed and well-nourished. No distress.  HENT:  Head: Normocephalic and atraumatic.  Mouth/Throat: Uvula is midline and mucous membranes are normal. No uvula swelling. Posterior oropharyngeal erythema present. No oropharyngeal exudate, posterior oropharyngeal edema or tonsillar abscesses.    Eyes: EOM are normal.  Neck: Normal range of motion.  Cardiovascular: Normal rate and regular rhythm.   Pulmonary/Chest: Effort normal.  Abdominal: Soft. She exhibits no distension. There is no tenderness.  Musculoskeletal: Normal range of motion.  Neurological: She is alert and oriented to person, place, and time.  Skin: Skin is warm and dry.  Psychiatric: She has a normal mood and affect. Judgment normal.    ED Course  Procedures (including critical care time)   Labs Reviewed  RAPID STREP SCREEN   No results  found.   1. Pharyngitis   2. Laryngitis       MDM  Viscous lidocaine, 150 ml        Evalina Field, [place] 03/31/12 1240

## 2012-03-31 NOTE — ED Notes (Signed)
Rash to tongue x 1 wk with loss of voice that started yesterday.

## 2012-03-31 NOTE — ED Provider Notes (Signed)
Medical screening examination/treatment/procedure(s) were performed by non-physician practitioner and as supervising physician I was immediately available for consultation/collaboration.   Chee Kinslow, MD 03/31/12 1619 

## 2012-03-31 NOTE — ED Notes (Addendum)
Pt presents with red streak type rash on tongue and c/o laryngitis with sore throat. Pt's voice is deeper than normal per pt. Pt denies fever, SOB and URI symptoms. NAD noted

## 2012-08-25 ENCOUNTER — Observation Stay (HOSPITAL_COMMUNITY)
Admission: EM | Admit: 2012-08-25 | Discharge: 2012-08-26 | Disposition: A | Discharge status: Home / Self Care | Payer: Medicare Other | Attending: Family Medicine | Admitting: Family Medicine

## 2012-08-25 ENCOUNTER — Emergency Department (HOSPITAL_COMMUNITY): Payer: Medicare Other

## 2012-08-25 ENCOUNTER — Encounter (HOSPITAL_COMMUNITY): Payer: Self-pay

## 2012-08-25 DIAGNOSIS — Z720 Tobacco use: Secondary | ICD-10-CM | POA: Diagnosis present

## 2012-08-25 DIAGNOSIS — E039 Hypothyroidism, unspecified: Secondary | ICD-10-CM | POA: Diagnosis present

## 2012-08-25 DIAGNOSIS — F17200 Nicotine dependence, unspecified, uncomplicated: Secondary | ICD-10-CM | POA: Insufficient documentation

## 2012-08-25 DIAGNOSIS — F419 Anxiety disorder, unspecified: Secondary | ICD-10-CM | POA: Diagnosis present

## 2012-08-25 DIAGNOSIS — R079 Chest pain, unspecified: Principal | ICD-10-CM | POA: Diagnosis present

## 2012-08-25 DIAGNOSIS — F411 Generalized anxiety disorder: Secondary | ICD-10-CM | POA: Insufficient documentation

## 2012-08-25 DIAGNOSIS — E785 Hyperlipidemia, unspecified: Secondary | ICD-10-CM | POA: Diagnosis present

## 2012-08-25 DIAGNOSIS — J449 Chronic obstructive pulmonary disease, unspecified: Secondary | ICD-10-CM | POA: Diagnosis present

## 2012-08-25 DIAGNOSIS — J4489 Other specified chronic obstructive pulmonary disease: Secondary | ICD-10-CM | POA: Insufficient documentation

## 2012-08-25 HISTORY — DX: Other chronic pain: G89.29

## 2012-08-25 HISTORY — DX: Chronic obstructive pulmonary disease, unspecified: J44.9

## 2012-08-25 HISTORY — DX: Emphysema, unspecified: J43.9

## 2012-08-25 HISTORY — DX: Anxiety disorder, unspecified: F41.9

## 2012-08-25 HISTORY — DX: Dorsalgia, unspecified: M54.9

## 2012-08-25 HISTORY — DX: Hyperlipidemia, unspecified: E78.5

## 2012-08-25 LAB — CBC WITH DIFFERENTIAL/PLATELET
Basophils Relative: 1 % (ref 0–1)
Eosinophils Absolute: 0.6 10*3/uL (ref 0.0–0.7)
HCT: 40.6 % (ref 36.0–46.0)
Hemoglobin: 14.1 g/dL (ref 12.0–15.0)
Lymphs Abs: 2.3 10*3/uL (ref 0.7–4.0)
MCH: 29.6 pg (ref 26.0–34.0)
MCHC: 34.7 g/dL (ref 30.0–36.0)
Monocytes Absolute: 0.5 10*3/uL (ref 0.1–1.0)
Monocytes Relative: 7 % (ref 3–12)
Neutro Abs: 3.7 10*3/uL (ref 1.7–7.7)
Neutrophils Relative %: 52 % (ref 43–77)
RBC: 4.77 MIL/uL (ref 3.87–5.11)

## 2012-08-25 LAB — BASIC METABOLIC PANEL
BUN: 15 mg/dL (ref 6–23)
Chloride: 102 mEq/L (ref 96–112)
Creatinine, Ser: 0.71 mg/dL (ref 0.50–1.10)
GFR calc Af Amer: >90 mL/min (ref >90)
Glucose, Bld: 107 mg/dL — ABNORMAL HIGH (ref 70–99)
Potassium: 3.8 mEq/L (ref 3.5–5.1)

## 2012-08-25 LAB — LIPID PANEL
LDL Cholesterol: 44 mg/dL (ref 0–99)
VLDL: 21 mg/dL (ref 0–40)

## 2012-08-25 LAB — TROPONIN I: Troponin I: <0.3 ng/mL (ref <0.30)

## 2012-08-25 MED ORDER — TIZANIDINE HCL 4 MG PO CAPS: 4.0000 mg | ORAL_CAPSULE | Freq: Two times a day (BID) | ORAL | Status: DC

## 2012-08-25 MED ORDER — SODIUM CHLORIDE 0.9 % IJ SOLN: 3.0000 mL | Freq: Two times a day (BID) | INTRAMUSCULAR | Status: DC

## 2012-08-25 MED ORDER — BUDESONIDE-FORMOTEROL FUMARATE 160-4.5 MCG/ACT IN AERO
2.0000 | INHALATION_SPRAY | Freq: Two times a day (BID) | RESPIRATORY_TRACT | Status: DC
  Administered 2012-08-25 – 2012-08-26 (×2): 2 via RESPIRATORY_TRACT
  Filled 2012-08-25: qty 6

## 2012-08-25 MED ORDER — MORPHINE SULFATE 2 MG/ML IJ SOLN: 1.0000 mg | INTRAMUSCULAR | Status: DC | PRN

## 2012-08-25 MED ORDER — DICLOFENAC SODIUM 75 MG PO TBEC
75.0000 mg | DELAYED_RELEASE_TABLET | Freq: Two times a day (BID) | ORAL | Status: DC
  Administered 2012-08-25 – 2012-08-26 (×2): 75 mg via ORAL
  Filled 2012-08-25 (×6): qty 1

## 2012-08-25 MED ORDER — TIZANIDINE HCL 4 MG PO TABS
4.0000 mg | ORAL_TABLET | Freq: Every day | ORAL | Status: DC
  Administered 2012-08-25 – 2012-08-26 (×2): 4 mg via ORAL
  Filled 2012-08-25 (×4): qty 1

## 2012-08-25 MED ORDER — ATORVASTATIN CALCIUM 20 MG PO TABS
20.0000 mg | ORAL_TABLET | Freq: Every evening | ORAL | Status: DC
  Administered 2012-08-25: 20 mg via ORAL
  Filled 2012-08-25: qty 1

## 2012-08-25 MED ORDER — VARENICLINE TARTRATE 1 MG PO TABS
0.5000 mg | ORAL_TABLET | Freq: Two times a day (BID) | ORAL | Status: DC
  Administered 2012-08-25 – 2012-08-26 (×2): 0.5 mg via ORAL
  Filled 2012-08-25 (×6): qty 1

## 2012-08-25 MED ORDER — TIZANIDINE HCL 4 MG PO TABS
8.0000 mg | ORAL_TABLET | Freq: Every day | ORAL | Status: DC
  Administered 2012-08-25: 8 mg via ORAL
  Filled 2012-08-25 (×3): qty 2

## 2012-08-25 MED ORDER — PANTOPRAZOLE SODIUM 40 MG PO TBEC
40.0000 mg | DELAYED_RELEASE_TABLET | Freq: Every day | ORAL | Status: DC
  Administered 2012-08-25 – 2012-08-26 (×2): 40 mg via ORAL
  Filled 2012-08-25 (×2): qty 1

## 2012-08-25 MED ORDER — ALPRAZOLAM 1 MG PO TABS: 1.0000 mg | ORAL_TABLET | Freq: Two times a day (BID) | ORAL | Status: DC | PRN

## 2012-08-25 MED ORDER — ACETAMINOPHEN 325 MG PO TABS: 650.0000 mg | ORAL_TABLET | Freq: Four times a day (QID) | ORAL | Status: DC | PRN

## 2012-08-25 MED ORDER — NITROGLYCERIN 0.4 MG SL SUBL: 0.4000 mg | SUBLINGUAL_TABLET | SUBLINGUAL | Status: DC | PRN

## 2012-08-25 MED ORDER — DIPHENHYDRAMINE HCL 50 MG/ML IJ SOLN: 12.5000 mg | Freq: Four times a day (QID) | INTRAMUSCULAR | Status: DC | PRN

## 2012-08-25 MED ORDER — ACETAMINOPHEN 650 MG RE SUPP: 650.0000 mg | Freq: Four times a day (QID) | RECTAL | Status: DC | PRN

## 2012-08-25 MED ORDER — ASPIRIN EC 325 MG PO TBEC
325.0000 mg | DELAYED_RELEASE_TABLET | Freq: Every day | ORAL | Status: DC
  Administered 2012-08-26: 325 mg via ORAL
  Filled 2012-08-25: qty 1

## 2012-08-25 MED ORDER — MORPHINE SULFATE 4 MG/ML IJ SOLN
4.0000 mg | Freq: Once | INTRAMUSCULAR | Status: AC
  Administered 2012-08-25: 4 mg via INTRAVENOUS
  Filled 2012-08-25: qty 1

## 2012-08-25 MED ORDER — TRAZODONE HCL 50 MG PO TABS
200.0000 mg | ORAL_TABLET | Freq: Every day | ORAL | Status: DC
  Administered 2012-08-25: 200 mg via ORAL
  Filled 2012-08-25: qty 4

## 2012-08-25 MED ORDER — DIPHENHYDRAMINE HCL 50 MG/ML IJ SOLN
25.0000 mg | Freq: Once | INTRAMUSCULAR | Status: AC
  Administered 2012-08-25: 25 mg via INTRAVENOUS
  Filled 2012-08-25: qty 1

## 2012-08-25 MED ORDER — LIOTHYRONINE SODIUM 5 MCG PO TABS
10.0000 ug | ORAL_TABLET | Freq: Every day | ORAL | Status: DC
  Administered 2012-08-26: 10 ug via ORAL
  Filled 2012-08-25 (×4): qty 2

## 2012-08-25 MED ORDER — SODIUM CHLORIDE 0.9 % IJ SOLN: 3.0000 mL | INTRAMUSCULAR | Status: DC | PRN

## 2012-08-25 MED ORDER — ALUM & MAG HYDROXIDE-SIMETH 200-200-20 MG/5ML PO SUSP: 15.0000 mL | ORAL | Status: DC | PRN

## 2012-08-25 MED ORDER — TIOTROPIUM BROMIDE MONOHYDRATE 18 MCG IN CAPS
18.0000 ug | ORAL_CAPSULE | Freq: Every day | RESPIRATORY_TRACT | Status: DC
  Administered 2012-08-26: 18 ug via RESPIRATORY_TRACT
  Filled 2012-08-25: qty 5

## 2012-08-25 MED ORDER — SODIUM CHLORIDE 0.9 % IV SOLN: 250.0000 mL | INTRAVENOUS | Status: DC | PRN

## 2012-08-25 MED ORDER — HYDROCODONE-ACETAMINOPHEN 5-325 MG PO TABS: 1.0000 | ORAL_TABLET | ORAL | Status: DC | PRN

## 2012-08-25 MED ORDER — NITROGLYCERIN 0.4 MG SL SUBL: 0.4000 mg | SUBLINGUAL_TABLET | Freq: Once | SUBLINGUAL | Status: DC

## 2012-08-25 MED ORDER — NITROGLYCERIN 0.4 MG SL SUBL
0.4000 mg | SUBLINGUAL_TABLET | Freq: Once | SUBLINGUAL | Status: AC
  Administered 2012-08-25: 0.4 mg via SUBLINGUAL
  Filled 2012-08-25: qty 25

## 2012-08-25 MED ORDER — ALBUTEROL SULFATE HFA 108 (90 BASE) MCG/ACT IN AERS
2.0000 | INHALATION_SPRAY | Freq: Four times a day (QID) | RESPIRATORY_TRACT | Status: DC | PRN
  Administered 2012-08-25 – 2012-08-26 (×2): 2 via RESPIRATORY_TRACT
  Filled 2012-08-25: qty 6.7

## 2012-08-25 MED ORDER — SODIUM CHLORIDE 0.9 % IJ SOLN
3.0000 mL | Freq: Two times a day (BID) | INTRAMUSCULAR | Status: DC
  Administered 2012-08-25 – 2012-08-26 (×2): 3 mL via INTRAVENOUS

## 2012-08-25 MED ORDER — ENOXAPARIN SODIUM 40 MG/0.4ML ~~LOC~~ SOLN
40.0000 mg | SUBCUTANEOUS | Status: DC
  Administered 2012-08-25: 40 mg via SUBCUTANEOUS
  Filled 2012-08-25: qty 0.4

## 2012-08-25 MED ORDER — AMITRIPTYLINE HCL 50 MG PO TABS
50.0000 mg | ORAL_TABLET | Freq: Every day | ORAL | Status: DC
  Administered 2012-08-25: 50 mg via ORAL
  Filled 2012-08-25 (×3): qty 1

## 2012-08-25 NOTE — ED Notes (Signed)
Dr. Preston Fleeting at bedside speaking with pt and family

## 2012-08-25 NOTE — ED Notes (Signed)
Pt complain of chest pain that started this morning. Asa 324 mg and ntg  1 sl given by EMS

## 2012-08-25 NOTE — ED Provider Notes (Signed)
History     This chart was scribed for Dione Booze, MD, MD by Smitty Pluck, ED Scribe. The patient was seen in room APA14/APA14 and the patient's care was started at 1:48 PM.   CSN: [id number]  Arrival date & time 08/25/12  1319      Chief Complaint  Patient presents with  . Chest Pain     The history is provided by the patient and medical records. No language interpreter was used.   HPI Comments: Kirsten Montgomery is a 52 y.o. female with hx of HTN and hyperlipidemia who presents to the Emergency Department BIB EMS complaining of constant, moderate chest pain radiating to right neck and jaw onset 830 AM today. She reports that pain is aggravated by breathing deeply. Pt rates the pain at 7/10 at worse but currently at 4/10. She reports that she was hyperventilating when she arrived at ED. Per EMS pt was given asa 324 mg and ntg 1 sl. Pt denies diaphoresis, fever, chills, nausea, vomiting, diarrhea, weakness, cough and any other pain. Pt denies hx of similar symptoms, MI, CVA and any other medical complications.  She states that her mom (50s) and dad (60s) had heart complications.   Pt reports that she smokes 1 pack/day.   Past Medical History  Diagnosis Date  . Cancer   . Collagen vascular disease   . Brain aneurysm   . Anxiety     Past Surgical History  Procedure Laterality Date  . Brain surgery    . Back surgery    . Cholecystectomy    . Oophorectomy      ? left   . Carpal tunnel release      No family history on file.  History  Substance Use Topics  . Smoking status: Current Every Day Smoker    Types: Cigarettes  . Smokeless tobacco: Not on file  . Alcohol Use: No    OB History   Grav Para Term Preterm Abortions TAB SAB Ect Mult Living                  Review of Systems  All other systems reviewed and are negative.    Allergies  Codeine; Cymbalta; Darvocet; and Demerol  Home Medications   Current Outpatient Rx  Name  Route  Sig  Dispense  Refill  .  albuterol (PROVENTIL HFA;VENTOLIN HFA) 108 (90 BASE) MCG/ACT inhaler   Inhalation   Inhale 2 puffs into the lungs every 6 (six) hours as needed for wheezing.         Marland Kitchen ALPRAZolam (XANAX) 1 MG tablet   Oral   Take 1 mg by mouth 3 (three) times daily as needed. For anxiety         . amitriptyline (ELAVIL) 50 MG tablet   Oral   Take 50 mg by mouth at bedtime.         Marland Kitchen aspirin 325 MG EC tablet   Oral   Take 325 mg by mouth daily.         Marland Kitchen atorvastatin (LIPITOR) 20 MG tablet   Oral   Take 20 mg by mouth daily.         . budesonide-formoterol (SYMBICORT) 160-4.5 MCG/ACT inhaler   Inhalation   Inhale 2 puffs into the lungs 2 (two) times daily.         . diclofenac (VOLTAREN) 75 MG EC tablet   Oral   Take 75 mg by mouth 2 (two) times daily.         Marland Kitchen  HYDROmorphone (DILAUDID) 2 MG tablet   Oral   Take 2 mg by mouth every 4 (four) hours as needed for pain.         Marland Kitchen liothyronine (CYTOMEL) 5 MCG tablet   Oral   Take 10 mcg by mouth daily.          Marland Kitchen tiotropium (SPIRIVA) 18 MCG inhalation capsule   Inhalation   Place 18 mcg into inhaler and inhale daily.         Marland Kitchen tiZANidine (ZANAFLEX) 4 MG capsule   Oral   Take 4-8 mg by mouth 2 (two) times daily. 1 in the morning and 2 at night         . traZODone (DESYREL) 100 MG tablet   Oral   Take 200 mg by mouth at bedtime.         . varenicline (CHANTIX) 0.5 MG tablet   Oral   Take 0.5 mg by mouth 2 (two) times daily.           BP 114/72  Pulse 91  Temp(Src) 98.2 F (36.8 C) (Oral)  Resp 18  Ht 5\' 2"  (1.575 m)  Wt 118 lb (53.524 kg)  BMI 21.58 kg/m2  SpO2 99%  Physical Exam  Nursing note and vitals reviewed. Constitutional: She is oriented to person, place, and time. She appears well-developed and well-nourished. No distress.  HENT:  Head: Normocephalic and atraumatic.  Eyes: Conjunctivae are normal.  Neck: Normal range of motion. Neck supple.  Cardiovascular: Normal rate, regular rhythm  and normal heart sounds.   No murmur heard. Pulmonary/Chest: Effort normal and breath sounds normal. No respiratory distress. She has no wheezes. She has no rales.  Mild tenderness in right parasternal area does not reproduce her pain   Abdominal: Soft. Bowel sounds are normal. She exhibits no distension. There is no tenderness. There is no rebound and no guarding.  Neurological: She is alert and oriented to person, place, and time.  Skin: Skin is warm.  Psychiatric: She has a normal mood and affect. Her behavior is normal.    ED Course  Procedures (including critical care time) DIAGNOSTIC STUDIES: Oxygen Saturation is 99% on room air, normal by my interpretation.    COORDINATION OF CARE: 1:56 PM Discussed ED treatment with pt and pt agrees.     Results for orders placed during the hospital encounter of 08/25/12  CBC WITH DIFFERENTIAL      Result Value Range   WBC 7.0  4.0 - 10.5 K/uL   RBC 4.77  3.87 - 5.11 MIL/uL   Hemoglobin 14.1  12.0 - 15.0 g/dL   HCT 16.1  09.6 - 04.5 %   MCV 85.1  78.0 - 100.0 fL   MCH 29.6  26.0 - 34.0 pg   MCHC 34.7  30.0 - 36.0 g/dL   RDW 40.9  81.1 - 91.4 %   Platelets 268  150 - 400 K/uL   Neutrophils Relative % 52  43 - 77 %   Neutro Abs 3.7  1.7 - 7.7 K/uL   Lymphocytes Relative 33  12 - 46 %   Lymphs Abs 2.3  0.7 - 4.0 K/uL   Monocytes Relative 7  3 - 12 %   Monocytes Absolute 0.5  0.1 - 1.0 K/uL   Eosinophils Relative 8 (*) 0 - 5 %   Eosinophils Absolute 0.6  0.0 - 0.7 K/uL   Basophils Relative 1  0 - 1 %   Basophils Absolute 0.1  0.0 -  0.1 K/uL  BASIC METABOLIC PANEL      Result Value Range   Sodium 140  135 - 145 mEq/L   Potassium 3.8  3.5 - 5.1 mEq/L   Chloride 102  96 - 112 mEq/L   CO2 25  19 - 32 mEq/L   Glucose, Bld 107 (*) 70 - 99 mg/dL   BUN 15  6 - 23 mg/dL   Creatinine, Ser 1.47  0.50 - 1.10 mg/dL   Calcium 82.9  8.4 - 56.2 mg/dL   GFR calc non Af Amer >90  >90 mL/min   GFR calc Af Amer >90  >90 mL/min  TROPONIN I       Result Value Range   Troponin I <0.30  <0.30 ng/mL   Dg Chest Portable 1 View  08/25/2012   *RADIOLOGY REPORT*  Clinical Data: Chest pain, hypertension  CHEST - 1 VIEW  Comparison:  08/02/2010  Findings: The heart size and mediastinal contours are within normal limits.  Both lungs are clear.  IMPRESSION: No active disease.   Original Report Authenticated By: Judie Petit. Shick, M.D.   ECG shows normal sinus rhythm with sinus arrhythmia with a rate of 92, no ectopy. Normal axis. Normal P wave. Normal QRS. Normal intervals. Normal ST and T waves. Impression: normal ECG. When compared with ECG of 05/28/2010, no significant changes are seen.    1. Chest pain       MDM  Chest pain in patient with significant cardiac risk factors including: smoking, hypertension, hyperlipidemia, and positive family history. 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 This means she has known atherosclerotic disease. She thought she had a stress test before surgery, but I could not find any record of one. She will be given additional NTG and reassessed.  Initial workup is negative with normal ECG and Troponin. Chest pain is almost completely gone with two additional NTG. She will be given a dose of Morphine, and will need admission for serial Troponin's. Case is discussed with Dr. Sherrie Mustache of [facility] who agrees to admit the patient.    I personally performed the services described in this documentation, which was scribed in my presence. The recorded information has been reviewed and is accurate.    Dione Booze, MD 08/25/12 1505

## 2012-08-25 NOTE — Progress Notes (Signed)
[facility] History and Physical  Karlea Mckibbin ZOX:[id number] DOB: 04-Feb-1961 DOA: 08/25/2012  Referring physician:  PCP: Ernestine Conrad, MD  Specialists:   Chief Complaint: chest pain  HPI: Kirsten Montgomery is a 52 y.o. female with past medical history of COPD, anxiety, and chronic back pain who presents to the emergency department today with the chief complaint of chest pain. Information is obtained from the patient. She states that she was in her usual state of health until this morning when she was awakened with pain in her left anterior chest that radiated to the right side of her neck or jaw. She describes the pain as a pressure dull constant pain. She rates the pain a 10 out of 10 initially.  Associated symptoms include numbness and tingling of bilateral upper extremities, nausea with no vomiting, and shortness of breath. She indicates that the pain waxed and waned from a rate of 5/10 to 10/10 from 5:30 AM until about 11 AM. She called her pulmonologist as she has just recently been diagnosed with COPD. He recommended that she go to the emergency department. She denies any recent illness or fever chills. She denies headache or blurred vision. She states that during this time period she paced the floor a little bit she tried to lie down to try and sleep. She reports never having experienced this pain before. She denies orthopnea. She denies lower extremity edema. She indicated that nothing made this pain better or worse. She indicated that it was not positional. She called EMS and reports that they commented that she was hyperventilating. She states that the pain began to subside once she was given morphine. In the emergency room her initial EKG is of sinus rhythm and nonacute. Her troponin is negative. She is being admitted for further evaluation.   Review of Systems: The patient denies anorexia, fever, weight loss,syncope,  peripheral edema, balance deficits, hemoptysis, abdominal pain,  melena, hematochezia, severe indigestion/heartburn, hematuria, incontinence, genital sores, muscle weakness, suspicious skin lesions, transient blindness, difficulty walking,  unusual weight change, abnormal bleeding, enlarged lymph nodes, angioedema, and breast masses.    Past Medical History  Diagnosis Date  . Cancer   . Collagen vascular disease   . Brain aneurysm   . Anxiety   . COPD (chronic obstructive pulmonary disease)   . Emphysema   . Chronic back pain    Past Surgical History  Procedure Laterality Date  . Brain surgery    . Back surgery    . Cholecystectomy    . Oophorectomy      ? left   . Carpal tunnel release     Social History:  Patient lives with her daughter. She is on disability. She smokes about one pack a day and has done so for 39 years. She does indicate that she's currently on Chantix trying to quit smoking she denies EtOH abuse. She denies illicit drug use appear  Allergies  Allergen Reactions  . Codeine Itching  . Cymbalta (Duloxetine Hcl) Itching  . Darvocet (Propoxyphene-Acetaminophen) Itching  . Demerol Itching    Family history: Her mother is alive in her medical history is positive for diabetes and heart disease. Father is deceased at 44. He had lung cancer and heart disease. She had one brother who died last year with lung cancer   Prior to Admission medications   Medication Sig Start Date End Date Taking? Authorizing Provider  albuterol (PROVENTIL HFA;VENTOLIN HFA) 108 (90 BASE) MCG/ACT inhaler Inhale 2 puffs into the lungs every 6 (  six) hours as needed for wheezing.   Yes Historical Provider, MD  ALPRAZolam Prudy Feeler) 1 MG tablet Take 1 mg by mouth 3 (three) times daily as needed. For anxiety   Yes Historical Provider, MD  amitriptyline (ELAVIL) 50 MG tablet Take 50 mg by mouth at bedtime.   Yes Historical Provider, MD  aspirin 325 MG EC tablet Take 325 mg by mouth daily.   Yes Historical Provider, MD  atorvastatin (LIPITOR) 20 MG tablet Take 20  mg by mouth daily.   Yes Historical Provider, MD  budesonide-formoterol (SYMBICORT) 160-4.5 MCG/ACT inhaler Inhale 2 puffs into the lungs 2 (two) times daily.   Yes Historical Provider, MD  diclofenac (VOLTAREN) 75 MG EC tablet Take 75 mg by mouth 2 (two) times daily.   Yes Historical Provider, MD  HYDROmorphone (DILAUDID) 2 MG tablet Take 2 mg by mouth every 4 (four) hours as needed for pain.   Yes Historical Provider, MD  liothyronine (CYTOMEL) 5 MCG tablet Take 10 mcg by mouth daily.    Yes Historical Provider, MD  tiotropium (SPIRIVA) 18 MCG inhalation capsule Place 18 mcg into inhaler and inhale daily.   Yes Historical Provider, MD  tiZANidine (ZANAFLEX) 4 MG capsule Take 4-8 mg by mouth 2 (two) times daily. 1 in the morning and 2 at night   Yes Historical Provider, MD  traZODone (DESYREL) 100 MG tablet Take 200 mg by mouth at bedtime.   Yes Historical Provider, MD  varenicline (CHANTIX) 0.5 MG tablet Take 0.5 mg by mouth 2 (two) times daily.   Yes Historical Provider, MD   Physical Exam: Filed Vitals:   08/25/12 1401 08/25/12 1405 08/25/12 1448 08/25/12 1448  BP: 112/70 100/63  106/63  Pulse:  107 94   Temp:      TempSrc:      Resp:  20 21   Height:      Weight:      SpO2:  98% 99%      General:  Well-nourished calm cooperative, no acute distress.  Eyes:  PERRL, EOMI, no scleral icterus  ENT: Ears clear, nose without drainage, or oropharynx without erythema or exudate. Mucous membranes moist and pink  Neck: Supple, no JVD, no lymphadenopathy  Cardiovascular: RRR, no murmur gallop or rub, no lower extremity edema, pedal pulses present and palpable  Respiratory: Normal effort, breath sounds clear to auscultation bilaterally, no wheeze, no rhonchi  Abdomen: Flat, soft, nondistended, positive bowel sounds throughout, no mass or organomegaly noted  Skin: Warm and dry, no rash or lesions  Musculoskeletal: No clubbing or cyanosis, joints without erythema or  swelling  Psychiatric: Calm, cooperative, and appropriate  Neurologic: Cranial nerves II through XII grossly intact, speech clear, facial symmetry,  Labs on Admission:  Basic Metabolic Panel:  Recent Labs Lab 08/25/12 1337  NA 140  K 3.8  CL 102  CO2 25  GLUCOSE 107*  BUN 15  CREATININE 0.71  CALCIUM 10.1   Liver Function Tests: No results found for this basename: AST, ALT, ALKPHOS, BILITOT, PROT, ALBUMIN,  in the last 168 hours No results found for this basename: LIPASE, AMYLASE,  in the last 168 hours No results found for this basename: AMMONIA,  in the last 168 hours CBC:  Recent Labs Lab 08/25/12 1337  WBC 7.0  NEUTROABS 3.7  HGB 14.1  HCT 40.6  MCV 85.1  PLT 268   Cardiac Enzymes:  Recent Labs Lab 08/25/12 1337  TROPONINI <0.30    BNP (last 3 results) No results  found for this basename: PROBNP,  in the last 8760 hours CBG: No results found for this basename: GLUCAP,  in the last 168 hours  Radiological Exams on Admission: Dg Chest Portable 1 View  08/25/2012   *RADIOLOGY REPORT*  Clinical Data: Chest pain, hypertension  CHEST - 1 VIEW  Comparison:  08/02/2010  Findings: The heart size and mediastinal contours are within normal limits.  Both lungs are clear.  IMPRESSION: No active disease.   Original Report Authenticated By: Judie Petit. Miles Costain, M.D.    EKG: Independently reviewed. Normal sinus rhythm with a heart rate of 92 beats per minute and no ST or T wave abnormalities.  Assessment/Plan Principal Problem:   Chest pain: Atypical. Given patient's risk factors will admit to telemetry for rule out. We will cycle cardiac enzymes and repeat EKG in the a.m. We will provide supportive care in the form of oxygen supplementation, nitroglycerin and anti-emetics. Patient indicates she has never had chest pain in the past. She does not have a cardiologist. She has never had a stress test. Active Problems:   Anxiety: The patient appears to be at baseline.  She takes  Xanax 1 milligram 3 times a day when necessary. Patient reports that she takes one pill in the morning and one pill at bedtime. Will order Xanax on an as-needed basis twice a day.Marland Kitchen   COPD (chronic obstructive pulmonary disease); chart review indicates that recent pulmonary function tests evaluated by Dr. Juanetta Gosling characterized COPD as mild. Continue home medications and inhalers.  Hypothyroidism: Will check TSH. Continue home meds  Hyperlipidemia: Will check lipid panel. Continue statin.  Tobacco: She counseled regarding smoking cessation. She indicates that she is in the process of quitting and taking Chantix. Will continue Chantix  Code Status: full Family Communication:  Disposition Plan: home when ready  Time spent: 60 minutes  [facility]   If 7PM-7AM, please contact night-coverage [email or web address] Password [facility] 08/25/2012, 3:33 PM    Attending: The patient was seen and examined. She was discussed with NP, Ms. Vedia Coffer. The above note has been either amended or annotated. The patient's chest pain symptomatology appears to be noncardiac and atypical. Differential diagnoses include chest wall pain, anxiety, and gastroesophageal reflux. She does have a positive family history and some risk factors. However, her normal troponin I. and her EKG are relatively normal and reassuring. It is anticipated that she will rule out for myocardial infarction. It will be reasonable to schedule her for an outpatient cardiology evaluation following discharge. We'll add a PPI and when necessary Mylanta.

## 2012-08-25 NOTE — ED Notes (Signed)
2nd dose of nitro SL given. Pt reports pain is 2/10 now. Pt reports "the pain is barely there."

## 2012-08-25 NOTE — ED Notes (Signed)
Pt ambulatory to restroom, tolerated well.  

## 2012-08-26 LAB — BASIC METABOLIC PANEL
Chloride: 103 mEq/L (ref 96–112)
GFR calc Af Amer: 87 mL/min — ABNORMAL LOW (ref >90)
Potassium: 3.8 mEq/L (ref 3.5–5.1)
Sodium: 142 mEq/L (ref 135–145)

## 2012-08-26 LAB — CBC
HCT: 39.7 % (ref 36.0–46.0)
Hemoglobin: 13.4 g/dL (ref 12.0–15.0)
WBC: 6.7 10*3/uL (ref 4.0–10.5)

## 2012-08-26 LAB — TROPONIN I
Troponin I: <0.3 ng/mL (ref <0.30)
Troponin I: <0.3 ng/mL (ref <0.30)

## 2012-08-26 MED ORDER — ALUM & MAG HYDROXIDE-SIMETH 200-200-20 MG/5ML PO SUSP: 15.0000 mL | ORAL | Status: DC | PRN

## 2012-08-26 MED ORDER — PANTOPRAZOLE SODIUM 40 MG PO TBEC: 40.0000 mg | DELAYED_RELEASE_TABLET | Freq: Every day | ORAL | Status: AC

## 2012-08-26 MED ORDER — ASPIRIN 81 MG PO TBEC: 81.0000 mg | DELAYED_RELEASE_TABLET | Freq: Every day | ORAL | Status: DC

## 2012-08-26 MED ORDER — NITROGLYCERIN 0.4 MG SL SUBL: 0.4000 mg | SUBLINGUAL_TABLET | SUBLINGUAL | Status: AC | PRN

## 2012-08-26 NOTE — Progress Notes (Addendum)
[facility]  Kirsten Montgomery WUJ:[id number] DOB: Apr 03, 1960 DOA: 08/25/2012 PCP: Kirsten Conrad, MD  Patient seen, independently examined and chart reviewed. I agree with exam, assessment and plan discussed with Kirsten Smothers, NP.  Interval history/Subjective: Feels "great". No chest pain or shortness of breath. She denies any chest pain or shortness of breath yesterday. No apparent pleuritic component. Reported symptoms improved with inhaler yesterday.  Objective: Afebrile, vital signs stable. She has required no pain medication or nitroglycerin since admission. Appears well. Cardiovascular regular rate and rhythm. Respiratory clear to auscultation bilaterally. No wheezes, rales, rhonchi. Normal respiratory effort.  Labs/studies: Basic metabolic panel unremarkable. Cardiac enzymes negative over 10 hour period. CBC unremarkable. TSH normal.chest x-ray negative.EKG 5/27 normal sinus rhythm, no acute changes. EKG 5/28 normal sinus rhythm, no acute changes  Assessment:  Chest pain, predominantly atypical features: Troponins negative, EKGs none acute. Wells score 0. No history to suggest VTE.  Positive family history, hypertension, hyperlipidemia, smoker  Cigarette smoker  Plan:  1 additional troponin. If negative plan discharge home. No recurrence of symptoms. She did obtain relief with her inhaler. Suspect COPD, consider GI (has chronic difficulty swallowing some foods) or anxiety/panic attack. Although she has cardiac risk factors her history and evaluation do not strongly suggest cardiac disease. Appears stable for outpatient cardiology evaluation.  Can change to 81 mg aspirin daily.  Although cardiac etiology is doubted, discharge with nitroglycerin when necessary until followup with cardiology. Instructed to return to the emergency department for recurrent pain.  Summary: 52 year old woman presented emergency department with chest pain felt to be atypical, noncardiac. She was admitted  for further evaluation. She reports her symptoms began shortly after getting up yesterday morning to take her grandson to work. She described the pain as a chest pressure sensation radiating up to her right jaw. She denies aggravating or alleviating factors except as described. No pleurisy. She had some shortness of breath with this. Her inhaler helped. She has no previous history of cardiac disease that she is aware of. She is very sedentary. She was recently diagnosed with COPD.  PMH includes:  Hypertension, hyperlipidemia  Brendia Sacks, MD [facility] [phone]

## 2012-08-26 NOTE — Discharge Summary (Signed)
Seen and agree with discharge. See my progress note same day.  Brendia Sacks, MD [facility] [phone]

## 2012-08-26 NOTE — H&P (Signed)
[facility]  History and Physical  Kirsten Montgomery EAV:[id number] DOB: 1961/02/14 DOA: 08/25/2012  Referring physician:  PCP: Ernestine Conrad, MD  Specialists:  Chief Complaint: chest pain  HPI: Kirsten Montgomery is a 52 y.o. female with past medical history of COPD, anxiety, and chronic back pain who presents to the emergency department today with the chief complaint of chest pain. Information is obtained from the patient. She states that she was in her usual state of health until this morning when she was awakened with pain in her left anterior chest that radiated to the right side of her neck or jaw. She describes the pain as a pressure dull constant pain. She rates the pain a 10 out of 10 initially. Associated symptoms include numbness and tingling of bilateral upper extremities, nausea with no vomiting, and shortness of breath. She indicates that the pain waxed and waned from a rate of 5/10 to 10/10 from 5:30 AM until about 11 AM. She called her pulmonologist as she has just recently been diagnosed with COPD. He recommended that she go to the emergency department. She denies any recent illness or fever chills. She denies headache or blurred vision. She states that during this time period she paced the floor a little bit she tried to lie down to try and sleep. She reports never having experienced this pain before. She denies orthopnea. She denies lower extremity edema. She indicated that nothing made this pain better or worse. She indicated that it was not positional. She called EMS and reports that they commented that she was hyperventilating. She states that the pain began to subside once she was given morphine. In the emergency room her initial EKG is of sinus rhythm and nonacute. Her troponin is negative. She is being admitted for further evaluation.  Review of Systems: The patient denies anorexia, fever, weight loss,syncope, peripheral edema, balance deficits, hemoptysis, abdominal pain, melena,  hematochezia, severe indigestion/heartburn, hematuria, incontinence, genital sores, muscle weakness, suspicious skin lesions, transient blindness, difficulty walking, unusual weight change, abnormal bleeding, enlarged lymph nodes, angioedema, and breast masses.  Past Medical History   Diagnosis  Date   .  Cancer    .  Collagen vascular disease    .  Brain aneurysm    .  Anxiety    .  COPD (chronic obstructive pulmonary disease)    .  Emphysema    .  Chronic back pain     Past Surgical History   Procedure  Laterality  Date   .  Brain surgery     .  Back surgery     .  Cholecystectomy     .  Oophorectomy       ? left   .  Carpal tunnel release      Social History:  Patient lives with her daughter. She is on disability. She smokes about one pack a day and has done so for 39 years. She does indicate that she's currently on Chantix trying to quit smoking she denies EtOH abuse. She denies illicit drug use appear  Allergies   Allergen  Reactions   .  Codeine  Itching   .  Cymbalta (Duloxetine Hcl)  Itching   .  Darvocet (Propoxyphene-Acetaminophen)  Itching   .  Demerol  Itching    Family history: Her mother is alive in her medical history is positive for diabetes and heart disease. Father is deceased at 50. He had lung cancer and heart disease. She had one brother who died last  year with lung cancer  Prior to Admission medications   Medication  Sig  Start Date  End Date  Taking?  Authorizing Provider   albuterol (PROVENTIL HFA;VENTOLIN HFA) 108 (90 BASE) MCG/ACT inhaler  Inhale 2 puffs into the lungs every 6 (six) hours as needed for wheezing.    Yes  Historical Provider, MD   ALPRAZolam Prudy Feeler) 1 MG tablet  Take 1 mg by mouth 3 (three) times daily as needed. For anxiety    Yes  Historical Provider, MD   amitriptyline (ELAVIL) 50 MG tablet  Take 50 mg by mouth at bedtime.    Yes  Historical Provider, MD   aspirin 325 MG EC tablet  Take 325 mg by mouth daily.    Yes  Historical Provider,  MD   atorvastatin (LIPITOR) 20 MG tablet  Take 20 mg by mouth daily.    Yes  Historical Provider, MD   budesonide-formoterol (SYMBICORT) 160-4.5 MCG/ACT inhaler  Inhale 2 puffs into the lungs 2 (two) times daily.    Yes  Historical Provider, MD   diclofenac (VOLTAREN) 75 MG EC tablet  Take 75 mg by mouth 2 (two) times daily.    Yes  Historical Provider, MD   HYDROmorphone (DILAUDID) 2 MG tablet  Take 2 mg by mouth every 4 (four) hours as needed for pain.    Yes  Historical Provider, MD   liothyronine (CYTOMEL) 5 MCG tablet  Take 10 mcg by mouth daily.    Yes  Historical Provider, MD   tiotropium (SPIRIVA) 18 MCG inhalation capsule  Place 18 mcg into inhaler and inhale daily.    Yes  Historical Provider, MD   tiZANidine (ZANAFLEX) 4 MG capsule  Take 4-8 mg by mouth 2 (two) times daily. 1 in the morning and 2 at night    Yes  Historical Provider, MD   traZODone (DESYREL) 100 MG tablet  Take 200 mg by mouth at bedtime.    Yes  Historical Provider, MD   varenicline (CHANTIX) 0.5 MG tablet  Take 0.5 mg by mouth 2 (two) times daily.    Yes  Historical Provider, MD   Physical Exam:  Filed Vitals:    08/25/12 1401  08/25/12 1405  08/25/12 1448  08/25/12 1448   BP:  112/70  100/63   106/63   Pulse:   107  94    Temp:       TempSrc:       Resp:   20  21    Height:       Weight:       SpO2:   98%  99%     General: Well-nourished calm cooperative, no acute distress.  Eyes: PERRL, EOMI, no scleral icterus  ENT: Ears clear, nose without drainage, or oropharynx without erythema or exudate. Mucous membranes moist and pink  Neck: Supple, no JVD, no lymphadenopathy  Cardiovascular: RRR, no murmur gallop or rub, no lower extremity edema, pedal pulses present and palpable  Respiratory: Normal effort, breath sounds clear to auscultation bilaterally, no wheeze, no rhonchi  Abdomen: Flat, soft, nondistended, positive bowel sounds throughout, no mass or organomegaly noted  Skin: Warm and dry, no rash or lesions   Musculoskeletal: No clubbing or cyanosis, joints without erythema or swelling  Psychiatric: Calm, cooperative, and appropriate  Neurologic: Cranial nerves II through XII grossly intact, speech clear, facial symmetry, Labs on Admission:  Basic Metabolic Panel:   Recent Labs  Lab  08/25/12 1337   NA  140  K  3.8   CL  102   CO2  25   GLUCOSE  107*   BUN  15   CREATININE  0.71   CALCIUM  10.1    Liver Function Tests:  No results found for this basename: AST, ALT, ALKPHOS, BILITOT, PROT, ALBUMIN, in the last 168 hours  No results found for this basename: LIPASE, AMYLASE, in the last 168 hours  No results found for this basename: AMMONIA, in the last 168 hours  CBC:   Recent Labs  Lab  08/25/12 1337   WBC  7.0   NEUTROABS  3.7   HGB  14.1   HCT  40.6   MCV  85.1   PLT  268    Cardiac Enzymes:   Recent Labs  Lab  08/25/12 1337   TROPONINI  <0.30    BNP (last 3 results)  No results found for this basename: PROBNP, in the last 8760 hours  CBG:  No results found for this basename: GLUCAP, in the last 168 hours  Radiological Exams on Admission:  Dg Chest Portable 1 View  08/25/2012 *RADIOLOGY REPORT* Clinical Data: Chest pain, hypertension CHEST - 1 VIEW Comparison: 08/02/2010 Findings: The heart size and mediastinal contours are within normal limits. Both lungs are clear. IMPRESSION: No active disease. Original Report Authenticated By: Judie Petit. Miles Costain, M.D.   EKG: Independently reviewed. Normal sinus rhythm with a heart rate of 92 beats per minute and no ST or T wave abnormalities.  Assessment/Plan  Principal Problem:  Chest pain: Atypical. Given patient's risk factors will admit to telemetry for rule out. We will cycle cardiac enzymes and repeat EKG in the a.m. We will provide supportive care in the form of oxygen supplementation, nitroglycerin and anti-emetics. Patient indicates she has never had chest pain in the past. She does not have a cardiologist. She has never had a  stress test.  Active Problems:  Anxiety: The patient appears to be at baseline. She takes Xanax 1 milligram 3 times a day when necessary. Patient reports that she takes one pill in the morning and one pill at bedtime. Will order Xanax on an as-needed basis twice a day.Marland Kitchen  COPD (chronic obstructive pulmonary disease); chart review indicates that recent pulmonary function tests evaluated by Dr. Juanetta Gosling characterized COPD as mild. Continue home medications and inhalers.  Hypothyroidism: Will check TSH. Continue home meds  Hyperlipidemia: Will check lipid panel. Continue statin.  Tobacco: She was counseled regarding smoking cessation. She indicates that she is in the process of quitting and taking Chantix. Will continue Chantix  Code Status: full  Family Communication:  Disposition Plan: home when ready  Time spent: 60 minutes  [facility] M  [facility]  If 7PM-7AM, please contact night-coverage    Attending:  The patient was seen and examined. She was discussed with NP, Ms. Vedia Coffer. The above note has been either amended or annotated. The patient's chest pain symptomatology appears to be noncardiac and atypical. Differential diagnoses include chest wall pain, anxiety, and gastroesophageal reflux. She does have a positive family history and some risk factors. However, her normal troponin I. and her EKG are relatively normal and reassuring. It is anticipated that she will rule out for myocardial infarction. It will be reasonable to schedule her for an outpatient cardiology evaluation following discharge. We'll add a PPI and when necessary Mylanta.

## 2012-08-26 NOTE — Discharge Summary (Signed)
Physician Discharge Summary  Kirsten Montgomery RUE:[id number] DOB: 1961-02-12 DOA: 08/25/2012  PCP: Ernestine Conrad, MD  Admit date: 08/25/2012 Discharge date: 08/26/2012  Time spent: 40 minutes  Recommendations for Outpatient Follow-up:  1. See PCP for follow up of symptoms in 1 week 2. Outpt cardiology  Discharge Diagnoses:  Principal Problem:   Chest pain Active Problems:   Anxiety   COPD (chronic obstructive pulmonary disease)   Hypothyroidism   Tobacco abuse   Hyperlipidemia   Discharge Condition: stable  Diet recommendation: heart healthy  Filed Weights   08/25/12 1328 08/25/12 1618  Weight: 53.524 kg (118 lb) 50.44 kg (111 lb 3.2 oz)    History of present illness:  Kirsten Montgomery is a 52 y.o. female with past medical history of COPD, anxiety, and chronic back pain who presented to the emergency department 08/25/12 with the chief complaint of chest pain. Information  obtained from the patient. She stated that she was in her usual state of health until  morning when she was awakened with pain in her left anterior chest that radiated to the right side of her neck and jaw. She described the pain as a pressure dull constant pain. She rated the pain a 10 out of 10 initially. Associated symptoms included numbness and tingling of bilateral upper extremities, nausea with no vomiting, and shortness of breath. She indicated that the pain waxed and waned from a rate of 5/10 to 10/10 from 5:30 AM until about 11 AM. She called her pulmonologist as she has just recently been diagnosed with COPD. He recommended that she go to the emergency department. She denied any recent illness or fever chills. She denied headache or blurred vision. She stated that during this time period she paced the floor a little bit she tried to lie down to sleep but was unable to sleep. She reported never having experienced this pain before. She denied orthopnea. She denied lower extremity edema. She indicated that nothing made  the pain better or worse. She indicated that it was not positional. She called EMS and reported that they commented that she was hyperventilating. She stated that the pain began to subside once she was given morphine. In the emergency room her initial EKG was sinus rhythm and nonacute. Her troponin was negative. She was admitted for further evaluation.      Hospital Course:  Chest pain: Atypical. Given patient's risk factors wasl admited to telemetry for rule out. Troponin neg x3 and repeat EKG remained non-acute. She reports no further episodes. She tolerated meals without problem.  Likely related to anxiety or gastroesophageal reflux. Will discharge with PPI . Will schedule outpatient cardiology evaluation.  Active Problems:  Anxiety: The patient remained at baseline. Will continue home meds at discharge.   COPD (chronic obstructive pulmonary disease); chart review indicates that recent pulmonary function tests evaluated by Dr. Juanetta Gosling characterized COPD as mild. Remained stable during this hospitalization.  Continue home medications and inhalers.   Hypothyroidism:TSH 0.53. Continue home meds and follow up with PCP  Hyperlipidemia: Lipid panel with HDL 38 otherwise unremarkable. Continue statin.   Tobacco: She counseled regarding smoking cessation. She indicates that she is in the process of quitting and taking Chantix.     Procedures:  none  Consultations:  none  Discharge Exam: Filed Vitals:   08/25/12 1929 08/25/12 2008 08/26/12 0446 08/26/12 0719  BP:  98/65 104/63   Pulse:  89 81   Temp:  97.9 F (36.6 C) 97.8 F (36.6 C)   TempSrc:  Oral Oral   Resp:  18 18   Height:      Weight:      SpO2: 99% 98% 98% 100%    General:well nourished, calm NAD Cardiovascular: RRR No MGR No LE edema Respiratory: normal effort BS clear bilaterally to auscultation. No wheeze no rhonchi Abdomen: soft +BS non-tender to palpation  Discharge Instructions       Future Appointments  Provider Department Dept Phone   09/02/2012 11:00 AM [facility] [place] [facility] [phone]       Medication List    TAKE these medications       albuterol 108 (90 BASE) MCG/ACT inhaler  Commonly known as:  PROVENTIL HFA;VENTOLIN HFA  Inhale 2 puffs into the lungs every 6 (six) hours as needed for wheezing.     ALPRAZolam 1 MG tablet  Commonly known as:  XANAX  Take 1 mg by mouth 3 (three) times daily as needed. For anxiety     alum & mag hydroxide-simeth 200-200-20 MG/5ML suspension  Commonly known as:  MAALOX/MYLANTA  Take 15 mLs by mouth every 4 (four) hours as needed.     amitriptyline 50 MG tablet  Commonly known as:  ELAVIL  Take 50 mg by mouth at bedtime.     aspirin 81 MG EC tablet  Take 1 tablet (81 mg total) by mouth daily. Swallow whole.     atorvastatin 20 MG tablet  Commonly known as:  LIPITOR  Take 20 mg by mouth daily.     budesonide-formoterol 160-4.5 MCG/ACT inhaler  Commonly known as:  SYMBICORT  Inhale 2 puffs into the lungs 2 (two) times daily.     diclofenac 75 MG EC tablet  Commonly known as:  VOLTAREN  Take 75 mg by mouth 2 (two) times daily.     HYDROmorphone 2 MG tablet  Commonly known as:  DILAUDID  Take 2 mg by mouth every 4 (four) hours as needed for pain.     liothyronine 5 MCG tablet  Commonly known as:  CYTOMEL  Take 10 mcg by mouth daily.     nitroGLYCERIN 0.4 MG SL tablet  Commonly known as:  NITROSTAT  Place 1 tablet (0.4 mg total) under the tongue every 5 (five) minutes as needed for chest pain.     pantoprazole 40 MG tablet  Commonly known as:  PROTONIX  Take 1 tablet (40 mg total) by mouth daily.     tiotropium 18 MCG inhalation capsule  Commonly known as:  SPIRIVA  Place 18 mcg into inhaler and inhale daily.     tiZANidine 4 MG capsule  Commonly known as:  ZANAFLEX  Take 4-8 mg by mouth 2 (two) times daily. 1 in the morning and 2 at night     traZODone 100 MG tablet  Commonly known as:   DESYREL  Take 200 mg by mouth at bedtime.     varenicline 0.5 MG tablet  Commonly known as:  CHANTIX  Take 0.5 mg by mouth 2 (two) times daily.       Allergies  Allergen Reactions  . Codeine Itching  . Cymbalta (Duloxetine Hcl) Itching  . Darvocet (Propoxyphene-Acetaminophen) Itching  . Demerol Itching   Follow-up Information   Follow up with Ernestine Conrad, MD. Schedule an appointment as soon as possible for a visit in 1 week. (evaluation of symptoms)    Contact information:   [facility] [address] [place] [address] [phone]       Follow  up On 09/02/2012. (Register at Radiology Dept at 8:30 am for stress test. Do not eat or drink after midnight.)        The results of significant diagnostics from this hospitalization (including imaging, microbiology, ancillary and laboratory) are listed below for reference.    Significant Diagnostic Studies: Dg Chest Portable 1 View  08/25/2012   *RADIOLOGY REPORT*  Clinical Data: Chest pain, hypertension  CHEST - 1 VIEW  Comparison:  08/02/2010  Findings: The heart size and mediastinal contours are within normal limits.  Both lungs are clear.  IMPRESSION: No active disease.   Original Report Authenticated By: Judie Petit. Miles Costain, M.D.    Microbiology: No results found for this or any previous visit (from the past 240 hour(s)).   Labs: Basic Metabolic Panel:  Recent Labs Lab 08/25/12 1337 08/26/12 0510  NA 140 142  K 3.8 3.8  CL 102 103  CO2 25 29  GLUCOSE 107* 91  BUN 15 14  CREATININE 0.71 0.88  CALCIUM 10.1 9.5   Liver Function Tests: No results found for this basename: AST, ALT, ALKPHOS, BILITOT, PROT, ALBUMIN,  in the last 168 hours No results found for this basename: LIPASE, AMYLASE,  in the last 168 hours No results found for this basename: AMMONIA,  in the last 168 hours CBC:  Recent Labs Lab 08/25/12 1337 08/26/12 0510  WBC 7.0 6.7  NEUTROABS 3.7  --   HGB 14.1 13.4  HCT 40.6 39.7  MCV 85.1 87.1   PLT 268 253   Cardiac Enzymes:  Recent Labs Lab 08/25/12 1337 08/25/12 1909 08/25/12 2355  TROPONINI <0.30 <0.30 <0.30   BNP: BNP (last 3 results) No results found for this basename: PROBNP,  in the last 8760 hours CBG: No results found for this basename: GLUCAP,  in the last 168 hours     Signed:  Gwenyth Bender  [facility] 08/26/2012, 1:45 PM

## 2012-08-26 NOTE — Care Management Note (Signed)
    Page 1 of 1   08/26/2012     12:17:58 PM   CARE MANAGEMENT NOTE 08/26/2012  Patient:  Kirsten Montgomery, Kirsten Montgomery   Account Number:  [id number]  Date Initiated:  08/26/2012  Documentation initiated by:  Sharrie Rothman  Subjective/Objective Assessment:   Pt admitted from home with CP. Pt lives with her daughter and will return home at discharge. Pt is independent with ADL's.     Action/Plan:   No CM needs noted. Pt discharged today.   Anticipated DC Date:  08/26/2012   Anticipated DC Plan:  HOME/SELF CARE      DC Planning Services  CM consult      Choice offered to / List presented to:             Status of service:  Completed, signed off Medicare Important Message given?   (If response is "NO", the following Medicare IM given date fields will be blank) Date Medicare IM given:   Date Additional Medicare IM given:    Discharge Disposition:  HOME/SELF CARE  Per UR Regulation:    If discussed at Long Length of Stay Meetings, dates discussed:    Comments:  08/26/12 1215 Arlyss Queen, RN BSN CM

## 2012-08-26 NOTE — Progress Notes (Signed)
UR Chart Review Completed  

## 2012-08-26 NOTE — Progress Notes (Signed)
IV removed, site WNL.  Pt given d/c instructions and new prescriptions.  Discussed home care with patient and discussed home medications, patient verbalizes understanding, teachback completed. F/U appointment in place, pt states they will keep appointment. Outpatient stress test also arranged, pt aware she needs to be NPO night prior. Pt is stable at this time. Pt taken to main entrance, denied need for wheelchair, ambulated with staff member.

## 2012-08-28 ENCOUNTER — Other Ambulatory Visit: Payer: Self-pay | Admitting: Cardiology

## 2012-08-28 DIAGNOSIS — R079 Chest pain, unspecified: Secondary | ICD-10-CM

## 2012-09-02 ENCOUNTER — Encounter (HOSPITAL_COMMUNITY): Payer: Medicare Other

## 2012-09-02 ENCOUNTER — Inpatient Hospital Stay (HOSPITAL_COMMUNITY): Admit: 2012-09-02 | Payer: Medicare Other

## 2013-07-13 ENCOUNTER — Emergency Department (HOSPITAL_COMMUNITY)
Admission: EM | Admit: 2013-07-13 | Discharge: 2013-07-13 | Disposition: A | Discharge status: Home / Self Care | Payer: Medicare Other | Attending: Emergency Medicine | Admitting: Emergency Medicine

## 2013-07-13 ENCOUNTER — Emergency Department (HOSPITAL_COMMUNITY): Payer: Medicare Other

## 2013-07-13 ENCOUNTER — Encounter (HOSPITAL_COMMUNITY): Payer: Self-pay | Admitting: Emergency Medicine

## 2013-07-13 DIAGNOSIS — G8929 Other chronic pain: Secondary | ICD-10-CM | POA: Insufficient documentation

## 2013-07-13 DIAGNOSIS — S63509A Unspecified sprain of unspecified wrist, initial encounter: Secondary | ICD-10-CM | POA: Insufficient documentation

## 2013-07-13 DIAGNOSIS — J438 Other emphysema: Secondary | ICD-10-CM | POA: Insufficient documentation

## 2013-07-13 DIAGNOSIS — Z79899 Other long term (current) drug therapy: Secondary | ICD-10-CM | POA: Insufficient documentation

## 2013-07-13 DIAGNOSIS — X500XXA Overexertion from strenuous movement or load, initial encounter: Secondary | ICD-10-CM | POA: Insufficient documentation

## 2013-07-13 DIAGNOSIS — Y9389 Activity, other specified: Secondary | ICD-10-CM | POA: Insufficient documentation

## 2013-07-13 DIAGNOSIS — F411 Generalized anxiety disorder: Secondary | ICD-10-CM | POA: Insufficient documentation

## 2013-07-13 DIAGNOSIS — Y929 Unspecified place or not applicable: Secondary | ICD-10-CM | POA: Insufficient documentation

## 2013-07-13 DIAGNOSIS — M359 Systemic involvement of connective tissue, unspecified: Secondary | ICD-10-CM | POA: Insufficient documentation

## 2013-07-13 DIAGNOSIS — E785 Hyperlipidemia, unspecified: Secondary | ICD-10-CM | POA: Insufficient documentation

## 2013-07-13 DIAGNOSIS — M310 Hypersensitivity angiitis: Secondary | ICD-10-CM | POA: Insufficient documentation

## 2013-07-13 DIAGNOSIS — F17200 Nicotine dependence, unspecified, uncomplicated: Secondary | ICD-10-CM | POA: Insufficient documentation

## 2013-07-13 DIAGNOSIS — Z859 Personal history of malignant neoplasm, unspecified: Secondary | ICD-10-CM | POA: Insufficient documentation

## 2013-07-13 DIAGNOSIS — Z9889 Other specified postprocedural states: Secondary | ICD-10-CM | POA: Insufficient documentation

## 2013-07-13 MED ORDER — HYDROCODONE-ACETAMINOPHEN 5-325 MG PO TABS: ORAL_TABLET | ORAL | Status: DC

## 2013-07-13 NOTE — Discharge Instructions (Signed)
Ligament Sprain  Ligaments are tough, fibrous tissues that hold bones together at the joints. A sprain can occur when a ligament is stretched. This injury may take several weeks to heal.  HOME CARE INSTRUCTIONS   · Rest the injured area for as long as directed by your caregiver. Then slowly start using the joint as directed by your caregiver and as the pain allows.  · Keep the affected joint raised if possible to lessen swelling.  · Apply ice for 15-20 minutes to the injured area every couple hours for the first half day, then 03-04 times per day for the first 48 hours. Put the ice in a plastic bag and place a towel between the bag of ice and your skin.  · Wear any splinting, casting, or elastic bandage applications as instructed.  · Only take over-the-counter or prescription medicines for pain, discomfort, or fever as directed by your caregiver. Do not use aspirin immediately after the injury unless instructed by your caregiver. Aspirin can cause increased bleeding and bruising of the tissues.  · If you were given crutches, continue to use them as instructed and do not resume weight bearing on the affected extremity until instructed.  SEEK MEDICAL CARE IF:   · Your bruising, swelling, or pain increases.  · You have cold and numb fingers or toes if your arm or leg was injured.  SEEK IMMEDIATE MEDICAL CARE IF:   · Your toes are numb or blue if your leg was injured.  · Your fingers are numb or blue if your arm was injured.  · Your pain is not responding to medicines and continues to stay the same or gets worse.  MAKE SURE YOU:   · Understand these instructions.  · Will watch your condition.  · Will get help right away if you are not doing well or get worse.  Document Released: 03/15/2000 Document Revised: 06/10/2011 Document Reviewed: 01/12/2008  [employer]® Patient Information ©2014 [employer].

## 2013-07-13 NOTE — ED Notes (Signed)
Patient c/o right wrist pain and swelling x2 days. Per patient walking puppy with leash around wrist when puppy jerked wrist causing pain. Denies taking any medications.

## 2013-07-13 NOTE — ED Notes (Signed)
Pain rt wrist, says she was pulled by 85 lbs, dog ,  No swelling.  Pain when moves her thumb  Good radial pulse

## 2013-07-15 NOTE — ED Provider Notes (Signed)
CSN: [id number]     Arrival date & time 07/13/13  1335 History   First MD Initiated Contact with Patient 07/13/13 1353     Chief Complaint  Patient presents with  . Wrist Pain     (Consider location/radiation/quality/duration/timing/severity/associated sxs/prior Treatment) Patient is a 53 y.o. female presenting with wrist pain. The history is provided by the patient.  Wrist Pain This is a new problem. Episode onset: 2 days. The problem occurs constantly. The problem has been unchanged. Associated symptoms include arthralgias and joint swelling. Pertinent negatives include no chills, fever, headaches, numbness, rash, vomiting or weakness. The symptoms are aggravated by bending and twisting. She has tried nothing for the symptoms. The treatment provided no relief.   Patient c/o pain to the medial right wrist for 2 days.  States the pain began after her dog pulled on a leash that was wrapped over her wrist. She is right hand dominant.  She denies previous injury or numbness of the distal fingers  Past Medical History  Diagnosis Date  . Cancer   . Collagen vascular disease   . Brain aneurysm   . Anxiety   . COPD (chronic obstructive pulmonary disease)   . Emphysema   . Chronic back pain   . Hyperlipidemia    Past Surgical History  Procedure Laterality Date  . Brain surgery    . Back surgery    . Cholecystectomy    . Oophorectomy      ? left   . Carpal tunnel release    . Abdominal hysterectomy    . Appendectomy     Family History  Problem Relation Age of Onset  . Diabetes Mother   . Heart disease Mother   . Cancer Other   . Cancer Brother    History  Substance Use Topics  . Smoking status: Current Every Day Smoker -- 1.00 packs/day for 40 years    Types: Cigarettes  . Smokeless tobacco: Never Used  . Alcohol Use: No   OB History   Grav Para Term Preterm Abortions TAB SAB Ect Mult Living   2 2 2       1      Review of Systems  Constitutional: Negative for fever and  chills.  Gastrointestinal: Negative for vomiting.  Genitourinary: Negative for dysuria and difficulty urinating.  Musculoskeletal: Positive for arthralgias and joint swelling.  Skin: Negative for color change, rash and wound.  Neurological: Negative for weakness, numbness and headaches.  All other systems reviewed and are negative.     Allergies  Codeine; Cymbalta; Darvocet; and Demerol  Home Medications   Prior to Admission medications   Medication Sig Start Date End Date Taking? Authorizing Provider  albuterol (PROVENTIL HFA;VENTOLIN HFA) 108 (90 BASE) MCG/ACT inhaler Inhale 2 puffs into the lungs every 6 (six) hours as needed for wheezing.   Yes Historical Provider, MD  ALPRAZolam Duanne Moron) 1 MG tablet Take 2 mg by mouth at bedtime. For anxiety   Yes Historical Provider, MD  amitriptyline (ELAVIL) 50 MG tablet Take 50 mg by mouth at bedtime.   Yes Historical Provider, MD  atorvastatin (LIPITOR) 20 MG tablet Take 20 mg by mouth daily.   Yes Historical Provider, MD  budesonide-formoterol (SYMBICORT) 160-4.5 MCG/ACT inhaler Inhale 2 puffs into the lungs 2 (two) times daily.   Yes Historical Provider, MD  diclofenac (VOLTAREN) 75 MG EC tablet Take 75 mg by mouth 2 (two) times daily.   Yes Historical Provider, MD  liothyronine (CYTOMEL) 5 MCG tablet Take  10 mcg by mouth daily.    Yes Historical Provider, MD  pantoprazole (PROTONIX) 40 MG tablet Take 1 tablet (40 mg total) by mouth daily. 08/26/12  Yes Lezlie Octave Black, NP  tiotropium (SPIRIVA) 18 MCG inhalation capsule Place 18 mcg into inhaler and inhale daily.   Yes Historical Provider, MD  tiZANidine (ZANAFLEX) 4 MG capsule Take 4-8 mg by mouth 2 (two) times daily. 1 in the morning and 2 at night   Yes Historical Provider, MD  traZODone (DESYREL) 100 MG tablet Take 200 mg by mouth at bedtime.   Yes Historical Provider, MD  HYDROcodone-acetaminophen (NORCO/VICODIN) 5-325 MG per tablet Take one-two tabs po q 4-6 hrs prn pain 07/13/13   Levern L.  Phylis Javed, PA-C  nitroGLYCERIN (NITROSTAT) 0.4 MG SL tablet Place 1 tablet (0.4 mg total) under the tongue every 5 (five) minutes as needed for chest pain. 08/26/12   Radene Gunning, NP   BP 113/82  Pulse 97  Temp(Src) 97.7 F (36.5 C) (Oral)  Resp 18  Ht 5\' 2"  (1.575 m)  Wt 117 lb (53.071 kg)  BMI 21.39 kg/m2  SpO2 100% Physical Exam  Nursing note and vitals reviewed. Constitutional: She is oriented to person, place, and time. She appears well-developed and well-nourished. No distress.  HENT:  Head: Normocephalic and atraumatic.  Cardiovascular: Normal rate, regular rhythm, normal heart sounds and intact distal pulses.   No murmur heard. Pulmonary/Chest: Effort normal and breath sounds normal. No respiratory distress.  Musculoskeletal: She exhibits tenderness. She exhibits no edema.       Right wrist: She exhibits tenderness. She exhibits normal range of motion, no bony tenderness, no swelling, no effusion, no crepitus, no deformity and no laceration.       Arms: ttp of the medial aspect of the right wrist mostly at the anatomical snuffbox.  No obvious ligament laxity.   Radial pulse is brisk, distal sensation intact.  CR< 2 sec.  No bruising or bony deformity.  Patient has full ROM  Neurological: She is alert and oriented to person, place, and time. She exhibits normal muscle tone. Coordination normal.  Skin: Skin is warm and dry.    ED Course  Procedures (including critical care time) Labs Review Labs Reviewed - No data to display  Imaging Review Dg Wrist Complete Right  07/13/2013   CLINICAL DATA:  Injury, pain and swelling  EXAM: RIGHT WRIST - COMPLETE 3+ VIEW  COMPARISON:  None.  FINDINGS: There is no evidence of fracture or dislocation. There is no evidence of arthropathy or other focal bone abnormality. Soft tissues are unremarkable.  IMPRESSION: No acute osseous finding   Electronically Signed   By: Daryll Brod M.D.   On: 07/13/2013 14:09    EKG Interpretation None        MDM   Final diagnoses:  Wrist sprain    Thumb spica splint applied.  Pain improved.  Remains NV intact.  Patient advised of importance of close orthopedic f/u due to risk of occult scaphoid fx .  She verbalized understanding and agreed to plan.  vicodin #15 for pain   Kahlee L. Vanessa Leon, PA-C 07/15/13 2228

## 2013-07-19 NOTE — ED Provider Notes (Signed)
Medical screening examination/treatment/procedure(s) were performed by non-physician practitioner and as supervising physician I was immediately available for consultation/collaboration.   EKG Interpretation None       Nat Christen, MD 07/19/13 [phone]

## 2014-01-31 ENCOUNTER — Encounter (HOSPITAL_COMMUNITY): Payer: Self-pay | Admitting: Emergency Medicine

## 2014-05-19 ENCOUNTER — Emergency Department (HOSPITAL_COMMUNITY): Payer: Medicare Other

## 2014-05-19 ENCOUNTER — Inpatient Hospital Stay (HOSPITAL_COMMUNITY)
Admission: EM | Admit: 2014-05-19 | Discharge: 2014-05-21 | DRG: 871 | Disposition: A | Discharge status: Home / Self Care | Payer: Medicare Other | Attending: Family Medicine | Admitting: Family Medicine

## 2014-05-19 ENCOUNTER — Encounter (HOSPITAL_COMMUNITY): Payer: Self-pay | Admitting: *Deleted

## 2014-05-19 DIAGNOSIS — A419 Sepsis, unspecified organism: Secondary | ICD-10-CM | POA: Diagnosis not present

## 2014-05-19 DIAGNOSIS — E785 Hyperlipidemia, unspecified: Secondary | ICD-10-CM | POA: Diagnosis present

## 2014-05-19 DIAGNOSIS — D649 Anemia, unspecified: Secondary | ICD-10-CM | POA: Diagnosis present

## 2014-05-19 DIAGNOSIS — Z9049 Acquired absence of other specified parts of digestive tract: Secondary | ICD-10-CM | POA: Diagnosis present

## 2014-05-19 DIAGNOSIS — J449 Chronic obstructive pulmonary disease, unspecified: Secondary | ICD-10-CM | POA: Diagnosis present

## 2014-05-19 DIAGNOSIS — I10 Essential (primary) hypertension: Secondary | ICD-10-CM | POA: Diagnosis present

## 2014-05-19 DIAGNOSIS — Z8543 Personal history of malignant neoplasm of ovary: Secondary | ICD-10-CM

## 2014-05-19 DIAGNOSIS — R197 Diarrhea, unspecified: Secondary | ICD-10-CM

## 2014-05-19 DIAGNOSIS — N289 Disorder of kidney and ureter, unspecified: Secondary | ICD-10-CM

## 2014-05-19 DIAGNOSIS — I959 Hypotension, unspecified: Secondary | ICD-10-CM | POA: Insufficient documentation

## 2014-05-19 DIAGNOSIS — F419 Anxiety disorder, unspecified: Secondary | ICD-10-CM | POA: Diagnosis present

## 2014-05-19 DIAGNOSIS — E039 Hypothyroidism, unspecified: Secondary | ICD-10-CM | POA: Diagnosis present

## 2014-05-19 DIAGNOSIS — Z90710 Acquired absence of both cervix and uterus: Secondary | ICD-10-CM

## 2014-05-19 DIAGNOSIS — M797 Fibromyalgia: Secondary | ICD-10-CM | POA: Diagnosis present

## 2014-05-19 DIAGNOSIS — A084 Viral intestinal infection, unspecified: Secondary | ICD-10-CM | POA: Diagnosis present

## 2014-05-19 DIAGNOSIS — G9340 Encephalopathy, unspecified: Secondary | ICD-10-CM | POA: Diagnosis present

## 2014-05-19 DIAGNOSIS — R6510 Systemic inflammatory response syndrome (SIRS) of non-infectious origin without acute organ dysfunction: Secondary | ICD-10-CM

## 2014-05-19 DIAGNOSIS — G8929 Other chronic pain: Secondary | ICD-10-CM | POA: Diagnosis present

## 2014-05-19 DIAGNOSIS — R112 Nausea with vomiting, unspecified: Secondary | ICD-10-CM

## 2014-05-19 DIAGNOSIS — F17210 Nicotine dependence, cigarettes, uncomplicated: Secondary | ICD-10-CM | POA: Diagnosis present

## 2014-05-19 DIAGNOSIS — A09 Infectious gastroenteritis and colitis, unspecified: Secondary | ICD-10-CM | POA: Diagnosis present

## 2014-05-19 DIAGNOSIS — Z8249 Family history of ischemic heart disease and other diseases of the circulatory system: Secondary | ICD-10-CM

## 2014-05-19 DIAGNOSIS — Z833 Family history of diabetes mellitus: Secondary | ICD-10-CM

## 2014-05-19 DIAGNOSIS — E860 Dehydration: Secondary | ICD-10-CM

## 2014-05-19 DIAGNOSIS — Z809 Family history of malignant neoplasm, unspecified: Secondary | ICD-10-CM

## 2014-05-19 DIAGNOSIS — R4182 Altered mental status, unspecified: Secondary | ICD-10-CM

## 2014-05-19 HISTORY — DX: Fibromyalgia: M79.7

## 2014-05-19 LAB — URINALYSIS, ROUTINE W REFLEX MICROSCOPIC
BILIRUBIN URINE: NEGATIVE
GLUCOSE, UA: NEGATIVE mg/dL
KETONES UR: NEGATIVE mg/dL
Leukocytes, UA: NEGATIVE
NITRITE: NEGATIVE
Specific Gravity, Urine: 1.025 (ref 1.005–1.030)
UROBILINOGEN UA: 0.2 mg/dL (ref 0.0–1.0)
pH: 5.5 (ref 5.0–8.0)

## 2014-05-19 LAB — COMPREHENSIVE METABOLIC PANEL
ALBUMIN: 3.1 g/dL — AB (ref 3.5–5.2)
ALK PHOS: 74 U/L (ref 39–117)
ALT: 18 U/L (ref 0–35)
ANION GAP: 7 (ref 5–15)
AST: 16 U/L (ref 0–37)
BILIRUBIN TOTAL: 0.6 mg/dL (ref 0.3–1.2)
BUN: 21 mg/dL (ref 6–23)
CHLORIDE: 102 mmol/L (ref 96–112)
CO2: 25 mmol/L (ref 19–32)
Calcium: 8.1 mg/dL — ABNORMAL LOW (ref 8.4–10.5)
Creatinine, Ser: 1.22 mg/dL — ABNORMAL HIGH (ref 0.50–1.10)
GFR, EST AFRICAN AMERICAN: 58 mL/min — AB (ref >90)
GFR, EST NON AFRICAN AMERICAN: 50 mL/min — AB (ref >90)
Glucose, Bld: 149 mg/dL — ABNORMAL HIGH (ref 70–99)
POTASSIUM: 3.8 mmol/L (ref 3.5–5.1)
Sodium: 134 mmol/L — ABNORMAL LOW (ref 135–145)
Total Protein: 6.1 g/dL (ref 6.0–8.3)

## 2014-05-19 LAB — RAPID URINE DRUG SCREEN, HOSP PERFORMED
Amphetamines: NOT DETECTED
BARBITURATES: NOT DETECTED
Benzodiazepines: POSITIVE — AB
Cocaine: NOT DETECTED
OPIATES: NOT DETECTED
Tetrahydrocannabinol: NOT DETECTED

## 2014-05-19 LAB — CBC WITH DIFFERENTIAL/PLATELET
BASOS ABS: 0 10*3/uL (ref 0.0–0.1)
BASOS PCT: 0 % (ref 0–1)
Eosinophils Absolute: 0 10*3/uL (ref 0.0–0.7)
Eosinophils Relative: 0 % (ref 0–5)
HCT: 30.6 % — ABNORMAL LOW (ref 36.0–46.0)
Hemoglobin: 10.1 g/dL — ABNORMAL LOW (ref 12.0–15.0)
LYMPHS PCT: 13 % (ref 12–46)
Lymphs Abs: 0.9 10*3/uL (ref 0.7–4.0)
MCH: 28.7 pg (ref 26.0–34.0)
MCHC: 33 g/dL (ref 30.0–36.0)
MCV: 86.9 fL (ref 78.0–100.0)
Monocytes Absolute: 1 10*3/uL (ref 0.1–1.0)
Monocytes Relative: 14 % — ABNORMAL HIGH (ref 3–12)
NEUTROS ABS: 5.4 10*3/uL (ref 1.7–7.7)
Neutrophils Relative %: 73 % (ref 43–77)
PLATELETS: 226 10*3/uL (ref 150–400)
RBC: 3.52 MIL/uL — ABNORMAL LOW (ref 3.87–5.11)
RDW: 13.4 % (ref 11.5–15.5)
WBC: 7.4 10*3/uL (ref 4.0–10.5)

## 2014-05-19 LAB — PROTIME-INR
INR: 1.28 (ref 0.00–1.49)
Prothrombin Time: 16.1 seconds — ABNORMAL HIGH (ref 11.6–15.2)

## 2014-05-19 LAB — URINE MICROSCOPIC-ADD ON

## 2014-05-19 LAB — POC OCCULT BLOOD, ED: FECAL OCCULT BLD: NEGATIVE

## 2014-05-19 LAB — ACETAMINOPHEN LEVEL: Acetaminophen (Tylenol), Serum: <10 ug/mL — ABNORMAL LOW (ref 10–30)

## 2014-05-19 LAB — LACTIC ACID, PLASMA: LACTIC ACID, VENOUS: 1.7 mmol/L (ref 0.5–2.0)

## 2014-05-19 LAB — LIPASE, BLOOD: LIPASE: 20 U/L (ref 11–59)

## 2014-05-19 LAB — TROPONIN I

## 2014-05-19 LAB — ETHANOL

## 2014-05-19 LAB — SALICYLATE LEVEL: Salicylate Lvl: <4 mg/dL (ref 2.8–20.0)

## 2014-05-19 MED ORDER — NALOXONE HCL 0.4 MG/ML IJ SOLN
0.4000 mg | Freq: Once | INTRAMUSCULAR | Status: AC
  Administered 2014-05-19: 0.4 mg via INTRAVENOUS

## 2014-05-19 MED ORDER — SODIUM CHLORIDE 0.9 % IV SOLN
INTRAVENOUS | Status: DC
  Administered 2014-05-19: via INTRAVENOUS

## 2014-05-19 MED ORDER — SODIUM CHLORIDE 0.9 % IV BOLUS (SEPSIS)
1000.0000 mL | Freq: Once | INTRAVENOUS | Status: AC
  Administered 2014-05-19: 1000 mL via INTRAVENOUS

## 2014-05-19 MED ORDER — NALOXONE HCL 0.4 MG/ML IJ SOLN
INTRAMUSCULAR | Status: AC
  Filled 2014-05-19: qty 1

## 2014-05-19 NOTE — ED Notes (Signed)
X-ray reports pt had one loose stool.

## 2014-05-19 NOTE — ED Notes (Signed)
Pt. Had no reaction to Narcan.

## 2014-05-19 NOTE — ED Notes (Signed)
Nausea, diarrhea. Fever, 102 at  MD office today.  Tonight awakened  , confusion , speech slow. Took her regular meds and Nyquil

## 2014-05-19 NOTE — ED Provider Notes (Signed)
CSN: [id number]     Arrival date & time 05/19/14  2034 History   First MD Initiated Contact with Patient 05/19/14 2057     Chief Complaint  Patient presents with  . Altered Mental Status      Patient is a 54 y.o. female presenting with altered mental status. The history is provided by the patient and a relative. The history is limited by the condition of the patient (AMS).  Altered Mental Status Pt was seen at 2100. Per pt's family and pt: Pt has c/o multiple episodes of diarrhea for the past 2 days, with associated home fevers to "102," nausea, and poor PO intake.  Pt was evaluated by her PMD today and dx virus. Pt's family states pt "likes to take swigs of nyquil when she's sick" and pt endorses she did so tonight, as well as took her usual meds. Pt's family states pt "was confused" and "very sleepy" (which is different from her usual response after taking nyquil) so they brought her to the ED for evaluation. Pt denies taking any other meds. Denies CP/SOB, no cough, no back pain, no vomiting, no black or blood in stools.    Past Medical History  Diagnosis Date  . Brain aneurysm   . Anxiety   . COPD (chronic obstructive pulmonary disease)   . Emphysema   . Chronic back pain   . Hyperlipidemia   . Cancer   . Collagen vascular disease   . Fibromyalgia    Past Surgical History  Procedure Laterality Date  . Brain surgery    . Back surgery    . Cholecystectomy    . Oophorectomy      ? left   . Carpal tunnel release    . Abdominal hysterectomy    . Appendectomy     Family History  Problem Relation Age of Onset  . Diabetes Mother   . Heart disease Mother   . Cancer Other   . Cancer Brother    History  Substance Use Topics  . Smoking status: Current Every Day Smoker -- 1.00 packs/day for 40 years    Types: Cigarettes  . Smokeless tobacco: Never Used  . Alcohol Use: No   OB History    Gravida Para Term Preterm AB TAB SAB Ectopic Multiple Living   2 2 2       1       Review of Systems  Unable to perform ROS: Mental status change      Allergies  Codeine; Cymbalta; Darvocet; and Demerol  Home Medications   Prior to Admission medications   Medication Sig Start Date End Date Taking? Authorizing Provider  ALPRAZolam Duanne Moron) 1 MG tablet Take 2 mg by mouth at bedtime. For anxiety   Yes Historical Provider, MD  amitriptyline (ELAVIL) 50 MG tablet Take 50 mg by mouth at bedtime.   Yes Historical Provider, MD  atorvastatin (LIPITOR) 20 MG tablet Take 20 mg by mouth daily.   Yes Historical Provider, MD  budesonide-formoterol (SYMBICORT) 160-4.5 MCG/ACT inhaler Inhale 2 puffs into the lungs 2 (two) times daily.   Yes Historical Provider, MD  diclofenac (VOLTAREN) 75 MG EC tablet Take 75 mg by mouth 2 (two) times daily.   Yes Historical Provider, MD  liothyronine (CYTOMEL) 5 MCG tablet Take 10 mcg by mouth daily.    Yes Historical Provider, MD  pantoprazole (PROTONIX) 40 MG tablet Take 1 tablet (40 mg total) by mouth daily. 08/26/12  Yes Radene Gunning, NP  tiotropium [facility])  18 MCG inhalation capsule Place 18 mcg into inhaler and inhale daily.   Yes Historical Provider, MD  tiZANidine (ZANAFLEX) 4 MG capsule Take 4-8 mg by mouth 2 (two) times daily. 1 in the morning and 2 at night   Yes Historical Provider, MD  traZODone (DESYREL) 100 MG tablet Take 200 mg by mouth at bedtime.   Yes Historical Provider, MD  albuterol (PROVENTIL HFA;VENTOLIN HFA) 108 (90 BASE) MCG/ACT inhaler Inhale 2 puffs into the lungs every 6 (six) hours as needed for wheezing.    Historical Provider, MD  HYDROcodone-acetaminophen (NORCO/VICODIN) 5-325 MG per tablet Take one-two tabs po q 4-6 hrs prn pain Patient not taking: Reported on 05/19/2014 07/13/13   Noreene L. Triplett, PA-C  nitroGLYCERIN (NITROSTAT) 0.4 MG SL tablet Place 1 tablet (0.4 mg total) under the tongue every 5 (five) minutes as needed for chest pain. 08/26/12   Radene Gunning, NP   BP 84/53 mmHg  Pulse 87  Temp(Src) 98.3  F (36.8 C) (Rectal)  Resp 13  Ht 5\' 2"  (1.575 m)  Wt 127 lb (57.607 kg)  BMI 23.22 kg/m2  SpO2 97%   Filed Vitals:   05/19/14 2215 05/19/14 2245 05/19/14 2315 05/19/14 2330  BP: 91/54 95/62 96/65  84/53  Pulse: 88 89 90 87  Temp:      TempSrc:      Resp: 17 17 15 13   Height:      Weight:      SpO2: 90% 100% 100% 97%    Physical Exam 2105: Physical examination:  Nursing notes reviewed; Vital signs and O2 SAT reviewed;  Constitutional: Well developed, Well nourished, In no acute distress; Head:  Normocephalic, atraumatic; Eyes: EOMI, PERRL, No scleral icterus; ENMT: Mouth and pharynx normal, Mucous membranes dry; Neck: Supple, Full range of motion, No lymphadenopathy; Cardiovascular: Regular rate and rhythm, No gallop; Respiratory: Breath sounds clear & equal bilaterally, No wheezes.  Speaking sentences, Normal respiratory effort/excursion; Chest: Nontender, Movement normal; Abdomen: Soft, Nontender, Nondistended, Normal bowel sounds; Genitourinary: No CVA tenderness; Extremities: Pulses normal, No tenderness, No edema, No calf edema or asymmetry.; Neuro: Lethargic, but arouses easily to her name, then falls back to sleep quickly. Major CN grossly intact. No facial droop. Speech slurred. Moves all extremities spontaneously and to command without apparent gross focal motor deficits.; Skin: Color normal, Warm, Dry.     ED Course  Procedures     EKG Interpretation None      MDM  MDM Reviewed: previous chart, nursing note and vitals Reviewed previous: labs and ECG Interpretation: labs, ECG, x-ray and CT scan Total time providing critical care: 30-74 minutes. This excludes time spent performing separately reportable procedures and services. Consults: admitting MD     CRITICAL CARE Performed by: Alfonzo Feller Total critical care time: 40 Critical care time was exclusive of separately billable procedures and treating other patients. Critical care was necessary to treat or  prevent imminent or life-threatening deterioration. Critical care was time spent personally by me on the following activities: development of treatment plan with patient and/or surrogate as well as nursing, discussions with consultants, evaluation of patient's response to treatment, examination of patient, obtaining history from patient or surrogate, ordering and performing treatments and interventions, ordering and review of laboratory studies, ordering and review of radiographic studies, pulse oximetry and re-evaluation of patient's condition.    Date: 05/19/2014  Rate: 82  Rhythm: normal sinus rhythm  QRS Axis: normal  Intervals: normal  ST/T Wave abnormalities: normal  Conduction Disutrbances:none  Narrative Interpretation:   Old EKG Reviewed: unchanged; no significant changes from previous EKG dated 08/26/2012.   Results for orders placed or performed during the hospital encounter of 05/19/14  Acetaminophen level  Result Value Ref Range   Acetaminophen (Tylenol), Serum <10.0 (L) 10 - 30 ug/mL  Ethanol  Result Value Ref Range   Alcohol, Ethyl (B) <5 0 - 9 mg/dL  Salicylate level  Result Value Ref Range   Salicylate Lvl <1.6 2.8 - 20.0 mg/dL  Urine rapid drug screen (hosp performed)  Result Value Ref Range   Opiates NONE DETECTED NONE DETECTED   Cocaine NONE DETECTED NONE DETECTED   Benzodiazepines POSITIVE (A) NONE DETECTED   Amphetamines NONE DETECTED NONE DETECTED   Tetrahydrocannabinol NONE DETECTED NONE DETECTED   Barbiturates NONE DETECTED NONE DETECTED  Urinalysis, Routine w reflex microscopic  Result Value Ref Range   Color, Urine YELLOW YELLOW   APPearance CLEAR CLEAR   Specific Gravity, Urine 1.025 1.005 - 1.030   pH 5.5 5.0 - 8.0   Glucose, UA NEGATIVE NEGATIVE mg/dL   Hgb urine dipstick TRACE (A) NEGATIVE   Bilirubin Urine NEGATIVE NEGATIVE   Ketones, ur NEGATIVE NEGATIVE mg/dL   Protein, ur TRACE (A) NEGATIVE mg/dL   Urobilinogen, UA 0.2 0.0 - 1.0 mg/dL    Nitrite NEGATIVE NEGATIVE   Leukocytes, UA NEGATIVE NEGATIVE  CBC with Differential/Platelet  Result Value Ref Range   WBC 7.4 4.0 - 10.5 K/uL   RBC 3.52 (L) 3.87 - 5.11 MIL/uL   Hemoglobin 10.1 (L) 12.0 - 15.0 g/dL   HCT 30.6 (L) 36.0 - 46.0 %   MCV 86.9 78.0 - 100.0 fL   MCH 28.7 26.0 - 34.0 pg   MCHC 33.0 30.0 - 36.0 g/dL   RDW 13.4 11.5 - 15.5 %   Platelets 226 150 - 400 K/uL   Neutrophils Relative % 73 43 - 77 %   Neutro Abs 5.4 1.7 - 7.7 K/uL   Lymphocytes Relative 13 12 - 46 %   Lymphs Abs 0.9 0.7 - 4.0 K/uL   Monocytes Relative 14 (H) 3 - 12 %   Monocytes Absolute 1.0 0.1 - 1.0 K/uL   Eosinophils Relative 0 0 - 5 %   Eosinophils Absolute 0.0 0.0 - 0.7 K/uL   Basophils Relative 0 0 - 1 %   Basophils Absolute 0.0 0.0 - 0.1 K/uL  Comprehensive metabolic panel  Result Value Ref Range   Sodium 134 (L) 135 - 145 mmol/L   Potassium 3.8 3.5 - 5.1 mmol/L   Chloride 102 96 - 112 mmol/L   CO2 25 19 - 32 mmol/L   Glucose, Bld 149 (H) 70 - 99 mg/dL   BUN 21 6 - 23 mg/dL   Creatinine, Ser 1.22 (H) 0.50 - 1.10 mg/dL   Calcium 8.1 (L) 8.4 - 10.5 mg/dL   Total Protein 6.1 6.0 - 8.3 g/dL   Albumin 3.1 (L) 3.5 - 5.2 g/dL   AST 16 0 - 37 U/L   ALT 18 0 - 35 U/L   Alkaline Phosphatase 74 39 - 117 U/L   Total Bilirubin 0.6 0.3 - 1.2 mg/dL   GFR calc non Af Amer 50 (L) >90 mL/min   GFR calc Af Amer 58 (L) >90 mL/min   Anion gap 7 5 - 15  Lipase, blood  Result Value Ref Range   Lipase 20 11 - 59 U/L  Protime-INR  Result Value Ref Range   Prothrombin Time 16.1 (H) 11.6 - 15.2  seconds   INR 1.28 0.00 - 1.49  Troponin I  Result Value Ref Range   Troponin I <0.03 <0.031 ng/mL  Lactic acid, plasma  Result Value Ref Range   Lactic Acid, Venous 1.7 0.5 - 2.0 mmol/L  Urine microscopic-add on  Result Value Ref Range   Squamous Epithelial / LPF RARE RARE   WBC, UA 3-6 <3 WBC/hpf   Bacteria, UA FEW (A) RARE   Casts GRANULAR CAST (A) NEGATIVE   Urine-Other AMORPHOUS URATES/PHOSPHATES     Ct Abdomen Pelvis Wo Contrast 05/19/2014   CLINICAL DATA:  Altered mental status, hypertension, nausea and uncontrolled bowel movements for 1 day.  EXAM: CT ABDOMEN AND PELVIS WITHOUT CONTRAST  TECHNIQUE: Multidetector CT imaging of the abdomen and pelvis was performed following the standard protocol without IV contrast.  COMPARISON:  CT abdomen and pelvis 05/28/2010.  FINDINGS: Mild dependent atelectasis is seen lung bases. No pleural or pericardial effusion.  The patient is status post cholecystectomy. A single punctate calcification is identified in the liver in the inferior right hepatic lobe. The liver is otherwise unremarkable. The spleen, adrenal glands and pancreas are unremarkable. There is no hydronephrosis on the right or left and no renal or ureteral stones are identified.  Liquid stool is seen throughout almost the entire colon with the exception of the cecum consistent with history of diarrhea. No evidence of colitis is identified. The stomach and small bowel appear normal. No pneumatosis, portal venous gas, free air or fluid collection is seen.  The uterus has been removed. Cystic lesion along the left pelvic sidewall measuring 2.5 x 3.5 cm is unchanged. No lymphadenopathy is identified.  No focal bony abnormality is seen. The patient is status post L4-S1 fusion.  IMPRESSION: Liquid stool throughout the colon consistent with history of diarrhea. The bowel is otherwise unremarkable.  Extensive aortoiliac atherosclerosis without aneurysm.  Status post hysterectomy. Cystic lesion along left pelvic sidewall seen on the prior examination is unchanged compatible with benignity. It may represent cyst in an ovarian remnant or a peritoneal inclusion cyst.  Status post hysterectomy, cholecystectomy and lower lumbar fusion.   Electronically Signed   By: Inge Rise M.D.   On: 05/19/2014 23:14   Dg Abd 1 View 05/19/2014   CLINICAL DATA:  PT c/o lower ABD pain, nausea, diarrhea, and fever x 2 days. Pt  took nyquil tonight and woke with slurred speech and confusion. Hypotension in ED currently. Hx COPD, chronic back pain, ovarian CA, smoker, cholecystectomy, hysterectomy, appendectomy  EXAM: ABDOMEN - 1 VIEW  COMPARISON:  CT, 05/28/2010  FINDINGS: Generalized decreased bowel gas. No bowel dilation is seen to suggest obstruction or generalized adynamic ileus.  Surgical staples noted along the pelvic sidewalls. Vascular clips are upper quadrant are noted from previous cholecystectomy. No other soft tissue abnormality. No evidence of renal or ureteral stones.  Status post lower lumbar spine posterior fusion, new from the prior CT.  IMPRESSION: 1. No acute findings. No evidence of bowel obstruction or generalized adynamic ileus. 2. Relative paucity of bowel gas is consistent with gastroenteritis.   Electronically Signed   By: Lajean Manes M.D.   On: 05/19/2014 21:31   Ct Head Wo Contrast 05/19/2014   CLINICAL DATA:  Aphasia.  Hypotension.  Altered mental status.  EXAM: CT HEAD WITHOUT CONTRAST  TECHNIQUE: Contiguous axial images were obtained from the base of the skull through the vertex without intravenous contrast.  COMPARISON:  12/29/2009  FINDINGS: There is no intracranial hemorrhage, mass or evidence  of acute infarction. There is no extra-axial fluid collection. There is prior left frontal craniotomy and left MCA aneurysm clip. Gray matter and white matter appear normal. Ventricles are midline and normal. Basal cisterns are normal.  IMPRESSION: Normal brain.  Prior craniotomy and left MCA aneurysm clip.   Electronically Signed   By: Andreas Newport M.D.   On: 05/19/2014 23:06   Dg Chest Port 1 View 05/19/2014   CLINICAL DATA:  Lower abdominal pain, nausea, diarrhea and fever for 2 days, awoke with slurred speech and confusion after Nyquil, hypotension in ED, COPD, ovarian cancer, smoker  EXAM: PORTABLE CHEST - 1 VIEW  COMPARISON:  Portable exam 2105 hr compared to 08/25/2012  FINDINGS: Normal heart size,  mediastinal contours, and pulmonary vascularity.  Mild peribronchial thickening.  No pulmonary infiltrate, pleural effusion, or pneumothorax.  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  Bones otherwise unremarkable.  Multiple EKG leads project over chest.  IMPRESSION: Mild bronchitic changes without infiltrate.   Electronically Signed   By: Lavonia Dana M.D.   On: 05/19/2014 21:29    2340:   Pt's SBP 61/42 on arrival to the ED; IVF bolus x2L NS given with slow improvement of SBP to 96/65. BUN/Cr elevated from baseline; IVF continued. Pt also lethargic on arrival, arousable to her name, but immediately fell back asleep. IV narcan given without effect. Pt remains lethargic, has become more easily arousable and talkative to ED staff and family throughout her ED stay. Family very concerned regarding pt's hx of brain aneurysm as cause for pt's lethargy ("because she always takes nyquil and her meds and doesn't get like this"); reassured CT-H without acute findings. CT A/P without acute colitis. Pt has stooled while in the ED; will collect cdiff and GI pathogen panel.  Dx and testing d/w pt and family.  Questions answered.  Verb understanding, agreeable to admit.  T/C to [facility]. Marin Comment, case discussed, including:  HPI, pertinent PM/SHx, VS/PE, dx testing, ED course and treatment:  Agreeable to admit, requests he will come to the ED for evaluation.     Francine Graven, DO 05/21/14 [phone]

## 2014-05-19 NOTE — ED Notes (Signed)
Pt. Appears more awake. Pt. Speech now clear.

## 2014-05-19 NOTE — ED Notes (Signed)
Pt. Given ice chips.

## 2014-05-20 ENCOUNTER — Encounter (HOSPITAL_COMMUNITY): Payer: Self-pay | Admitting: Internal Medicine

## 2014-05-20 DIAGNOSIS — J449 Chronic obstructive pulmonary disease, unspecified: Secondary | ICD-10-CM | POA: Diagnosis present

## 2014-05-20 DIAGNOSIS — Z833 Family history of diabetes mellitus: Secondary | ICD-10-CM | POA: Diagnosis not present

## 2014-05-20 DIAGNOSIS — A419 Sepsis, unspecified organism: Secondary | ICD-10-CM | POA: Diagnosis present

## 2014-05-20 DIAGNOSIS — Z809 Family history of malignant neoplasm, unspecified: Secondary | ICD-10-CM | POA: Diagnosis not present

## 2014-05-20 DIAGNOSIS — G8929 Other chronic pain: Secondary | ICD-10-CM | POA: Diagnosis present

## 2014-05-20 DIAGNOSIS — Z9049 Acquired absence of other specified parts of digestive tract: Secondary | ICD-10-CM | POA: Diagnosis present

## 2014-05-20 DIAGNOSIS — I959 Hypotension, unspecified: Secondary | ICD-10-CM | POA: Insufficient documentation

## 2014-05-20 DIAGNOSIS — R197 Diarrhea, unspecified: Secondary | ICD-10-CM

## 2014-05-20 DIAGNOSIS — E860 Dehydration: Secondary | ICD-10-CM

## 2014-05-20 DIAGNOSIS — D649 Anemia, unspecified: Secondary | ICD-10-CM | POA: Diagnosis present

## 2014-05-20 DIAGNOSIS — A084 Viral intestinal infection, unspecified: Secondary | ICD-10-CM | POA: Diagnosis present

## 2014-05-20 DIAGNOSIS — Z90710 Acquired absence of both cervix and uterus: Secondary | ICD-10-CM | POA: Diagnosis not present

## 2014-05-20 DIAGNOSIS — A09 Infectious gastroenteritis and colitis, unspecified: Secondary | ICD-10-CM | POA: Diagnosis present

## 2014-05-20 DIAGNOSIS — I10 Essential (primary) hypertension: Secondary | ICD-10-CM | POA: Diagnosis present

## 2014-05-20 DIAGNOSIS — E785 Hyperlipidemia, unspecified: Secondary | ICD-10-CM | POA: Diagnosis present

## 2014-05-20 DIAGNOSIS — F419 Anxiety disorder, unspecified: Secondary | ICD-10-CM | POA: Diagnosis present

## 2014-05-20 DIAGNOSIS — Z8249 Family history of ischemic heart disease and other diseases of the circulatory system: Secondary | ICD-10-CM | POA: Diagnosis not present

## 2014-05-20 DIAGNOSIS — E039 Hypothyroidism, unspecified: Secondary | ICD-10-CM | POA: Diagnosis present

## 2014-05-20 DIAGNOSIS — M797 Fibromyalgia: Secondary | ICD-10-CM | POA: Diagnosis present

## 2014-05-20 DIAGNOSIS — G9340 Encephalopathy, unspecified: Secondary | ICD-10-CM | POA: Diagnosis present

## 2014-05-20 DIAGNOSIS — F17210 Nicotine dependence, cigarettes, uncomplicated: Secondary | ICD-10-CM | POA: Diagnosis present

## 2014-05-20 DIAGNOSIS — Z8543 Personal history of malignant neoplasm of ovary: Secondary | ICD-10-CM | POA: Diagnosis not present

## 2014-05-20 LAB — BASIC METABOLIC PANEL
Anion gap: 3 — ABNORMAL LOW (ref 5–15)
BUN: 14 mg/dL (ref 6–23)
CHLORIDE: 109 mmol/L (ref 96–112)
CO2: 25 mmol/L (ref 19–32)
CREATININE: 0.85 mg/dL (ref 0.50–1.10)
Calcium: 8.1 mg/dL — ABNORMAL LOW (ref 8.4–10.5)
GFR calc non Af Amer: 77 mL/min — ABNORMAL LOW (ref >90)
GFR, EST AFRICAN AMERICAN: 90 mL/min — AB (ref >90)
GLUCOSE: 125 mg/dL — AB (ref 70–99)
Potassium: 3.5 mmol/L (ref 3.5–5.1)
Sodium: 137 mmol/L (ref 135–145)

## 2014-05-20 LAB — CBC
HCT: 32.4 % — ABNORMAL LOW (ref 36.0–46.0)
Hemoglobin: 10.5 g/dL — ABNORMAL LOW (ref 12.0–15.0)
MCH: 28.3 pg (ref 26.0–34.0)
MCHC: 32.4 g/dL (ref 30.0–36.0)
MCV: 87.3 fL (ref 78.0–100.0)
PLATELETS: 203 10*3/uL (ref 150–400)
RBC: 3.71 MIL/uL — ABNORMAL LOW (ref 3.87–5.11)
RDW: 13.6 % (ref 11.5–15.5)
WBC: 5.2 10*3/uL (ref 4.0–10.5)

## 2014-05-20 LAB — CLOSTRIDIUM DIFFICILE BY PCR: CDIFFPCR: NEGATIVE

## 2014-05-20 LAB — TSH: TSH: 0.546 u[IU]/mL (ref 0.350–4.500)

## 2014-05-20 LAB — MRSA PCR SCREENING: MRSA by PCR: NEGATIVE

## 2014-05-20 MED ORDER — ONDANSETRON HCL 4 MG PO TABS: 4.0000 mg | ORAL_TABLET | Freq: Four times a day (QID) | ORAL | Status: DC | PRN

## 2014-05-20 MED ORDER — LIOTHYRONINE SODIUM 5 MCG PO TABS
10.0000 ug | ORAL_TABLET | Freq: Every day | ORAL | Status: DC
  Administered 2014-05-20 – 2014-05-21 (×2): 10 ug via ORAL
  Filled 2014-05-20 (×4): qty 2

## 2014-05-20 MED ORDER — CIPROFLOXACIN IN D5W 400 MG/200ML IV SOLN
400.0000 mg | Freq: Two times a day (BID) | INTRAVENOUS | Status: DC
  Administered 2014-05-20 – 2014-05-21 (×3): 400 mg via INTRAVENOUS
  Filled 2014-05-20 (×3): qty 200

## 2014-05-20 MED ORDER — KCL IN DEXTROSE-NACL 20-5-0.9 MEQ/L-%-% IV SOLN
INTRAVENOUS | Status: DC
  Administered 2014-05-20: 02:00:00 via INTRAVENOUS

## 2014-05-20 MED ORDER — ALBUTEROL SULFATE (2.5 MG/3ML) 0.083% IN NEBU
3.0000 mL | INHALATION_SOLUTION | Freq: Four times a day (QID) | RESPIRATORY_TRACT | Status: DC | PRN
Start: 2014-05-20 — End: 2014-05-21

## 2014-05-20 MED ORDER — PANTOPRAZOLE SODIUM 40 MG PO TBEC
40.0000 mg | DELAYED_RELEASE_TABLET | Freq: Every day | ORAL | Status: DC
  Administered 2014-05-20 – 2014-05-21 (×2): 40 mg via ORAL
  Filled 2014-05-20 (×2): qty 1

## 2014-05-20 MED ORDER — TIOTROPIUM BROMIDE MONOHYDRATE 18 MCG IN CAPS
18.0000 ug | ORAL_CAPSULE | Freq: Every day | RESPIRATORY_TRACT | Status: DC
  Administered 2014-05-20 – 2014-05-21 (×2): 18 ug via RESPIRATORY_TRACT
  Filled 2014-05-20: qty 5

## 2014-05-20 MED ORDER — ONDANSETRON HCL 4 MG/2ML IJ SOLN: 4.0000 mg | Freq: Four times a day (QID) | INTRAMUSCULAR | Status: DC | PRN

## 2014-05-20 MED ORDER — HEPARIN SODIUM (PORCINE) 5000 UNIT/ML IJ SOLN
5000.0000 [IU] | Freq: Three times a day (TID) | INTRAMUSCULAR | Status: DC
  Administered 2014-05-20 – 2014-05-21 (×4): 5000 [IU] via SUBCUTANEOUS
  Filled 2014-05-20 (×4): qty 1

## 2014-05-20 MED ORDER — MOMETASONE FURO-FORMOTEROL FUM 200-5 MCG/ACT IN AERO
2.0000 | INHALATION_SPRAY | Freq: Two times a day (BID) | RESPIRATORY_TRACT | Status: DC
  Administered 2014-05-20 – 2014-05-21 (×3): 2 via RESPIRATORY_TRACT
  Filled 2014-05-20: qty 8.8

## 2014-05-20 MED ORDER — ATORVASTATIN CALCIUM 20 MG PO TABS
20.0000 mg | ORAL_TABLET | Freq: Every day | ORAL | Status: DC
  Administered 2014-05-20: 20 mg via ORAL
  Filled 2014-05-20: qty 1

## 2014-05-20 NOTE — H&P (Signed)
[facility] History and Physical  Kirsten Montgomery:[id number] DOB: May 22, 1960    PCP:   Celedonio Savage, MD   Chief Complaint: diarrhea, nausea, vomiting and lethargy.   HPI: Kirsten Montgomery is an 54 y.o. female with hx of COPD, anxiety, chronic back pain, HLD, fibromyalgia, brought to the ER as she has been more lethargic today.  She had fever to 102, nausea, vomiting, and profuse diarrhea for the past 2 days.  She has no ill contact, distant travel, or recent antibiotic use. She saw her PCP and was Dx with viral gastroenteritis.  She took her usual medications including flexeril, benzo, and nyquil, and became sleepy.  Evalaution in the ER found her to be hypotensive, with SBP reportedly in the 60, fortunately responded to IVF to 98.  She is now alert, converse meaningfully, and wanting to go home.  She has an abdominal pelvic CT which showed liquid stool in the colon, and nothing acute, in keeping with her hx of diarrhea.  She has no leukocytosis, and normal renal fx with negative salicylate, tylenol, lipase, and lactic acid levels.  Hospitalist was asked to admit her for volume depletion, diarrhea, and altered mental status.   Rewiew of Systems:  Constitutional: Negative for malaise, fever and chills. No significant weight loss or weight gain Eyes: Negative for eye pain, redness and discharge, diplopia, visual changes, or flashes of light. ENMT: Negative for ear pain, hoarseness, nasal congestion, sinus pressure and sore throat. No headaches; tinnitus, drooling, or problem swallowing. Cardiovascular: Negative for chest pain, palpitations, diaphoresis, dyspnea and peripheral edema. ; No orthopnea, PND Respiratory: Negative for cough, hemoptysis, wheezing and stridor. No pleuritic chestpain. Gastrointestinal: Negative for nausea, vomiting, diarrhea, constipation, abdominal pain, melena, blood in stool, hematemesis, jaundice and rectal bleeding.    Genitourinary: Negative for frequency,  dysuria, incontinence,flank pain and hematuria; Musculoskeletal: Negative for back pain and neck pain. Negative for swelling and trauma.;  Skin: . Negative for pruritus, rash, abrasions, bruising and skin lesion.; ulcerations Neuro: Negative for headache, lightheadedness and neck stiffness. Negative for weakness, altered level of consciousness , altered mental status, extremity weakness, burning feet, involuntary movement, seizure and syncope.  Psych: negative for anxiety, depression, insomnia, tearfulness, panic attacks, hallucinations, paranoia, suicidal or homicidal ideation   Past Medical History  Diagnosis Date  . Brain aneurysm   . Anxiety   . COPD (chronic obstructive pulmonary disease)   . Emphysema   . Chronic back pain   . Hyperlipidemia   . Cancer   . Collagen vascular disease   . Fibromyalgia     Past Surgical History  Procedure Laterality Date  . Brain surgery    . Back surgery    . Cholecystectomy    . Oophorectomy      ? left   . Carpal tunnel release    . Abdominal hysterectomy    . Appendectomy      Medications:  HOME MEDS: Prior to Admission medications   Medication Sig Start Date End Date Taking? Authorizing Provider  ALPRAZolam Duanne Moron) 1 MG tablet Take 2 mg by mouth at bedtime. For anxiety   Yes Historical Provider, MD  amitriptyline (ELAVIL) 50 MG tablet Take 50 mg by mouth at bedtime.   Yes Historical Provider, MD  atorvastatin (LIPITOR) 20 MG tablet Take 20 mg by mouth daily.   Yes Historical Provider, MD  budesonide-formoterol (SYMBICORT) 160-4.5 MCG/ACT inhaler Inhale 2 puffs into the lungs 2 (two) times daily.   Yes Historical Provider, MD  diclofenac (  VOLTAREN) 75 MG EC tablet Take 75 mg by mouth 2 (two) times daily.   Yes Historical Provider, MD  liothyronine (CYTOMEL) 5 MCG tablet Take 10 mcg by mouth daily.    Yes Historical Provider, MD  pantoprazole (PROTONIX) 40 MG tablet Take 1 tablet (40 mg total) by mouth daily. 08/26/12  Yes Lezlie Octave  Black, NP  tiotropium (SPIRIVA) 18 MCG inhalation capsule Place 18 mcg into inhaler and inhale daily.   Yes Historical Provider, MD  tiZANidine (ZANAFLEX) 4 MG capsule Take 4-8 mg by mouth 2 (two) times daily. 1 in the morning and 2 at night   Yes Historical Provider, MD  traZODone (DESYREL) 100 MG tablet Take 200 mg by mouth at bedtime.   Yes Historical Provider, MD  albuterol (PROVENTIL HFA;VENTOLIN HFA) 108 (90 BASE) MCG/ACT inhaler Inhale 2 puffs into the lungs every 6 (six) hours as needed for wheezing.    Historical Provider, MD  HYDROcodone-acetaminophen (NORCO/VICODIN) 5-325 MG per tablet Take one-two tabs po q 4-6 hrs prn pain Patient not taking: Reported on 05/19/2014 07/13/13   Emmamae L. Triplett, PA-C  nitroGLYCERIN (NITROSTAT) 0.4 MG SL tablet Place 1 tablet (0.4 mg total) under the tongue every 5 (five) minutes as needed for chest pain. 08/26/12   Radene Gunning, NP     Allergies:  Allergies  Allergen Reactions  . Codeine Itching  . Cymbalta [Duloxetine Hcl] Itching  . Darvocet [Propoxyphene N-Acetaminophen] Itching  . Demerol Itching    Social History:   reports that she has been smoking Cigarettes.  She has a 40 pack-year smoking history. She has never used smokeless tobacco. She reports that she does not drink alcohol or use illicit drugs.  Family History: Family History  Problem Relation Age of Onset  . Diabetes Mother   . Heart disease Mother   . Cancer Other   . Cancer Brother      Physical Exam: Filed Vitals:   05/19/14 2215 05/19/14 2245 05/19/14 2315 05/19/14 2330  BP: 91/54 95/62 96/65  84/53  Pulse: 88 89 90 87  Temp:      TempSrc:      Resp: 17 17 15 13   Height:      Weight:      SpO2: 90% 100% 100% 97%   Blood pressure 84/53, pulse 87, temperature 98.3 F (36.8 C), temperature source Rectal, resp. rate 13, height 5\' 2"  (1.575 m), weight 57.607 kg (127 lb), SpO2 97 %.  GEN:  Pleasant  patient lying in the stretcher in no acute distress; cooperative  with exam. PSYCH:  alert and oriented x4; does not appear anxious or depressed; affect is appropriate. HEENT: Mucous membranes pink and anicteric; PERRLA; EOM intact; no cervical lymphadenopathy nor thyromegaly or carotid bruit; no JVD; There were no stridor. Neck is very supple. Breasts:: Not examined CHEST WALL: No tenderness CHEST: Normal respiration, clear to auscultation bilaterally.  HEART: Regular rate and rhythm.  There are no murmur, rub, or gallops.   BACK: No kyphosis or scoliosis; no CVA tenderness ABDOMEN: soft and non-tender; no masses, no organomegaly, normal abdominal bowel sounds; no pannus; no intertriginous candida. There is no rebound and no distention. Rectal Exam: Not done EXTREMITIES: No bone or joint deformity; age-appropriate arthropathy of the hands and knees; no edema; no ulcerations.  There is no calf tenderness. Genitalia: not examined PULSES: 2+ and symmetric SKIN: Normal hydration no rash or ulceration CNS: Cranial nerves 2-12 grossly intact no focal lateralizing neurologic deficit.  Speech is fluent; uvula  elevated with phonation, facial symmetry and tongue midline. DTR are normal bilaterally, cerebella exam is intact, barbinski is negative and strengths are equaled bilaterally.  No sensory loss.   Labs on Admission:  Basic Metabolic Panel:  Recent Labs Lab 05/19/14 2118  NA 134*  K 3.8  CL 102  CO2 25  GLUCOSE 149*  BUN 21  CREATININE 1.22*  CALCIUM 8.1*   Liver Function Tests:  Recent Labs Lab 05/19/14 2118  AST 16  ALT 18  ALKPHOS 74  BILITOT 0.6  PROT 6.1  ALBUMIN 3.1*    Recent Labs Lab 05/19/14 2118  LIPASE 20   No results for input(s): AMMONIA in the last 168 hours. CBC:  Recent Labs Lab 05/19/14 2118  WBC 7.4  NEUTROABS 5.4  HGB 10.1*  HCT 30.6*  MCV 86.9  PLT 226   Cardiac Enzymes:  Recent Labs Lab 05/19/14 2118  TROPONINI <0.03    CBG: No results for input(s): GLUCAP in the last 168  hours.   Radiological Exams on Admission: Ct Abdomen Pelvis Wo Contrast  05/19/2014   CLINICAL DATA:  Altered mental status, hypertension, nausea and uncontrolled bowel movements for 1 day.  EXAM: CT ABDOMEN AND PELVIS WITHOUT CONTRAST  TECHNIQUE: Multidetector CT imaging of the abdomen and pelvis was performed following the standard protocol without IV contrast.  COMPARISON:  CT abdomen and pelvis 05/28/2010.  FINDINGS: Mild dependent atelectasis is seen lung bases. No pleural or pericardial effusion.  The patient is status post cholecystectomy. A single punctate calcification is identified in the liver in the inferior right hepatic lobe. The liver is otherwise unremarkable. The spleen, adrenal glands and pancreas are unremarkable. There is no hydronephrosis on the right or left and no renal or ureteral stones are identified.  Liquid stool is seen throughout almost the entire colon with the exception of the cecum consistent with history of diarrhea. No evidence of colitis is identified. The stomach and small bowel appear normal. No pneumatosis, portal venous gas, free air or fluid collection is seen.  The uterus has been removed. Cystic lesion along the left pelvic sidewall measuring 2.5 x 3.5 cm is unchanged. No lymphadenopathy is identified.  No focal bony abnormality is seen. The patient is status post L4-S1 fusion.  IMPRESSION: Liquid stool throughout the colon consistent with history of diarrhea. The bowel is otherwise unremarkable.  Extensive aortoiliac atherosclerosis without aneurysm.  Status post hysterectomy. Cystic lesion along left pelvic sidewall seen on the prior examination is unchanged compatible with benignity. It may represent cyst in an ovarian remnant or a peritoneal inclusion cyst.  Status post hysterectomy, cholecystectomy and lower lumbar fusion.   Electronically Signed   By: Inge Rise M.D.   On: 05/19/2014 23:14   Dg Abd 1 View  05/19/2014   CLINICAL DATA:  PT c/o lower ABD  pain, nausea, diarrhea, and fever x 2 days. Pt took nyquil tonight and woke with slurred speech and confusion. Hypotension in ED currently. Hx COPD, chronic back pain, ovarian CA, smoker, cholecystectomy, hysterectomy, appendectomy  EXAM: ABDOMEN - 1 VIEW  COMPARISON:  CT, 05/28/2010  FINDINGS: Generalized decreased bowel gas. No bowel dilation is seen to suggest obstruction or generalized adynamic ileus.  Surgical staples noted along the pelvic sidewalls. Vascular clips are upper quadrant are noted from previous cholecystectomy. No other soft tissue abnormality. No evidence of renal or ureteral stones.  Status post lower lumbar spine posterior fusion, new from the prior CT.  IMPRESSION: 1. No acute findings. No evidence  of bowel obstruction or generalized adynamic ileus. 2. Relative paucity of bowel gas is consistent with gastroenteritis.   Electronically Signed   By: Lajean Manes M.D.   On: 05/19/2014 21:31   Ct Head Wo Contrast  05/19/2014   CLINICAL DATA:  Aphasia.  Hypotension.  Altered mental status.  EXAM: CT HEAD WITHOUT CONTRAST  TECHNIQUE: Contiguous axial images were obtained from the base of the skull through the vertex without intravenous contrast.  COMPARISON:  12/29/2009  FINDINGS: There is no intracranial hemorrhage, mass or evidence of acute infarction. There is no extra-axial fluid collection. There is prior left frontal craniotomy and left MCA aneurysm clip. Gray matter and white matter appear normal. Ventricles are midline and normal. Basal cisterns are normal.  IMPRESSION: Normal brain.  Prior craniotomy and left MCA aneurysm clip.   Electronically Signed   By: Andreas Newport M.D.   On: 05/19/2014 23:06   Dg Chest Port 1 View  05/19/2014   CLINICAL DATA:  Lower abdominal pain, nausea, diarrhea and fever for 2 days, awoke with slurred speech and confusion after Nyquil, hypotension in ED, COPD, ovarian cancer, smoker  EXAM: PORTABLE CHEST - 1 VIEW  COMPARISON:  Portable exam 2105 hr  compared to 08/25/2012  FINDINGS: Normal heart size, mediastinal contours, and pulmonary vascularity.  Mild peribronchial thickening.  No pulmonary infiltrate, pleural effusion, or pneumothorax.  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  Bones otherwise unremarkable.  Multiple EKG leads project over chest.  IMPRESSION: Mild bronchitic changes without infiltrate.   Electronically Signed   By: Lavonia Dana M.D.   On: 05/19/2014 21:29   Assessment/Plan Present on Admission:  . Infectious diarrhea . Dehydration . Hypothyroidism . Anxiety . Diarrhea  PLAN:  I suspect she has infectious diarrhea, making her volume depleted, and in addition to her meds, giving her altered mental status.  She is certainly back to her baseline, but is still borderline hypotensive.  Will admit her OBS, continue with IVF, obtain stool studies, including C diff (doubtful), and because of her fever and being ill, I will also start her on IV Cipro.  She is feeling more alert and better after IVF.   For her hypothyroidism, will continue with Cytomel and obtain TSH.  Will hold her sedative medications for now, and when she is completely better, consider resuming them at lower doses.  She is stable, full code, and will be admitted to [facility] service.  Thank you for allowing me to participate in her care.   Other plans as per orders.  Code Status: FULL Haskel Khan, MD. [facility] Pager [phone] 7pm to 7am.  05/20/2014, 12:16 AM

## 2014-05-20 NOTE — Progress Notes (Signed)
Pt transferring to unit 300 today. Pt/family is aware and agreeable to transfer. Assessment is unchanged from this morning and receiving RN has been given report. Belongings sent with pt at bedside.  

## 2014-05-20 NOTE — Care Management Note (Signed)
    Page 1 of 1   05/20/2014     3:50:02 PM CARE MANAGEMENT NOTE 05/20/2014  Patient:  Kirsten Montgomery, Kirsten Montgomery   Account Number:  [id number]  Date Initiated:  05/20/2014  Documentation initiated by:  CHILDRESS,JESSICA  Subjective/Objective Assessment:   Pt is from home, lives alone and is independent with ADL's. Pt has no HH services, DME's or med needs prior to admission. Pt plans to discharge home with self care. No CM needs identified.     Action/Plan:   Anticipated DC Date:  05/21/2014   Anticipated DC Plan:  [place]  CM consult      Choice offered to / List presented to:             Status of service:  Completed, signed off Medicare Important Message given?   (If response is "NO", the following Medicare IM given date fields will be blank) Date Medicare IM given:   Medicare IM given by:   Date Additional Medicare IM given:   Additional Medicare IM given by:    Discharge Disposition:  HOME/SELF CARE  Per UR Regulation:    If discussed at Long Length of Stay Meetings, dates discussed:    Comments:  05/20/2014 [place], RN, MSN, CM

## 2014-05-20 NOTE — ED Notes (Signed)
Pt. Daughter Theadora Rama can be contacted at [phone]

## 2014-05-20 NOTE — Progress Notes (Signed)
PROGRESS NOTE  Kirsten Montgomery WRU:[id number] DOB: January 14, 1961 DOA: 05/19/2014 PCP: Celedonio Savage, MD  Summary: 54 year old woman presented to the emergency department with history of fever up to 102, nausea, vomiting, profuse diarrhea for 48 hours. No recent antibiotic use. Also noted to be lethargic and hypotensive, responded to IV fluids. Presumed infectious diarrhea, treated with IV fluids and empiric ciprofloxacin.  Assessment/Plan: 1. SIRS/sepsis with hypotension, appears resolved. Secondary to diarrhea, presumed viral gastroenteritis. 2. N/v/diarrhea, presumed infectious diarrhea. CT ab/pelvis with liquid stool throughout colon, otherwise unremarkable. Improving.  3. Dehydration. Clinically improving. 4. Acute encephalopathy. Resolved. Likely secondary to acute illness and hypotension. No neurologic signs or symptoms. 5. Normocytic anemia, significance unclear. 6. COPD on Spiriva, appears stable. 7. Chronic back pain 8. Fibromyalgia on Xanax 2 mg QHS, Norco, Zanaflex 9. Hypothyroidism. TSH WNL. 10. History of brain aneurysm 11. Aortoiliac atherosclerosis asymptomatic.   Much improved, hypotension resolved, GI symptoms improving  Start clears and advance as tolerated  F/u GI pathogen panel  Check BMP and CBC today, check anemia panel.  Transfer to floor  Anticipate home next 24 hours  Code Status: full code DVT prophylaxis: heparin Family Communication: none Disposition Plan: home  Murray Hodgkins, MD  [facility]  Pager [phone] If 7PM-7AM, please contact night-coverage at [email or web address], password [facility] 05/20/2014, 8:08 AM    Consultants:    Procedures:    Antibiotics:  Ciprofloxacin 2/18 >>  HPI/Subjective: Feeling much better; no vomiting, wants liquids. Diarrhea slowing down.  Objective: Filed Vitals:   05/20/14 0300 05/20/14 0400 05/20/14 0500 05/20/14 0700  BP: 106/54 97/49 98/52  109/56  Pulse:  82  99  Temp:  97.9 F (36.6 C)  98.4 F  (36.9 C)  TempSrc:  Oral  Oral  Resp:  14  16  Height:      Weight:      SpO2:  98%  100%    Intake/Output Summary (Last 24 hours) at 05/20/14 0808 Last data filed at 05/20/14 0221  Gross per 24 hour  Intake 3495.62 ml  Output      0 ml  Net 3495.62 ml     Filed Weights   05/19/14 2041 05/20/14 0150  Weight: 57.607 kg (127 lb) 59.8 kg (131 lb 13.4 oz)    Exam:     Afebrile, VSS, no hypoxia General:  Appears calm and comfortable Eyes: PERRL, normal lids, irises ENT: grossly normal hearing, lips  Cardiovascular: RRR, no m/r/g. No LE edema. Telemetry: SR, no arrhythmias  Respiratory: CTA bilaterally, no w/r/r. Normal respiratory effort. Abdomen: soft, ntnd, positive bowel sounds Skin: no rash or induration seen Musculoskeletal: grossly normal tone BUE/BLE, moves legs well Psychiatric: grossly normal mood and affect, speech fluent and appropriate. Oriented to self, location, month, year Neurologic: grossly non-focal.  Data Reviewed:  No labs today.  Admission labs notable for creatinine of 1.22, normal hepatic fxn panel, negative troponin, normal lactic acid, Hgb 10, normal TSH, negative U/A and cdiff, negative serum alcohol.  EKG SR  Pending data:  GI pathogen panel  UC  Scheduled Meds: . atorvastatin  20 mg Oral q1800  . ciprofloxacin  400 mg Intravenous Q12H  . heparin  5,000 Units Subcutaneous 3 times per day  . liothyronine  10 mcg Oral QAC breakfast  . mometasone-formoterol  2 puff Inhalation BID  . pantoprazole  40 mg Oral Daily  . tiotropium  18 mcg Inhalation Daily   Continuous Infusions: . dextrose 5 % and 0.9 % NaCl with KCl 20 mEq/L 150  mL/hr at 05/20/14 0217    Active Problems:   Anxiety   Hypothyroidism   Infectious diarrhea   Dehydration   Diarrhea   Time spent 20 minutes

## 2014-05-20 NOTE — ED Notes (Signed)
Attempted to call report, RN to call back.

## 2014-05-21 DIAGNOSIS — A419 Sepsis, unspecified organism: Principal | ICD-10-CM

## 2014-05-21 DIAGNOSIS — R6510 Systemic inflammatory response syndrome (SIRS) of non-infectious origin without acute organ dysfunction: Secondary | ICD-10-CM

## 2014-05-21 DIAGNOSIS — D649 Anemia, unspecified: Secondary | ICD-10-CM

## 2014-05-21 DIAGNOSIS — G9340 Encephalopathy, unspecified: Secondary | ICD-10-CM

## 2014-05-21 DIAGNOSIS — R112 Nausea with vomiting, unspecified: Secondary | ICD-10-CM

## 2014-05-21 DIAGNOSIS — R197 Diarrhea, unspecified: Secondary | ICD-10-CM

## 2014-05-21 LAB — BASIC METABOLIC PANEL
Anion gap: 3 — ABNORMAL LOW (ref 5–15)
BUN: 7 mg/dL (ref 6–23)
CO2: 27 mmol/L (ref 19–32)
Calcium: 8.3 mg/dL — ABNORMAL LOW (ref 8.4–10.5)
Chloride: 107 mmol/L (ref 96–112)
Creatinine, Ser: 0.8 mg/dL (ref 0.50–1.10)
GFR calc non Af Amer: 83 mL/min — ABNORMAL LOW (ref >90)
GLUCOSE: 98 mg/dL (ref 70–99)
Potassium: 3.4 mmol/L — ABNORMAL LOW (ref 3.5–5.1)
SODIUM: 137 mmol/L (ref 135–145)

## 2014-05-21 LAB — CBC
HCT: 30.9 % — ABNORMAL LOW (ref 36.0–46.0)
HEMOGLOBIN: 10.1 g/dL — AB (ref 12.0–15.0)
MCH: 28.5 pg (ref 26.0–34.0)
MCHC: 32.7 g/dL (ref 30.0–36.0)
MCV: 87 fL (ref 78.0–100.0)
PLATELETS: 217 10*3/uL (ref 150–400)
RBC: 3.55 MIL/uL — AB (ref 3.87–5.11)
RDW: 13.2 % (ref 11.5–15.5)
WBC: 6.3 10*3/uL (ref 4.0–10.5)

## 2014-05-21 LAB — URINE CULTURE
COLONY COUNT: NO GROWTH
Culture: NO GROWTH

## 2014-05-21 MED ORDER — CIPROFLOXACIN HCL 500 MG PO TABS: 500.0000 mg | ORAL_TABLET | Freq: Two times a day (BID) | ORAL | Status: AC

## 2014-05-21 MED ORDER — AMITRIPTYLINE HCL 100 MG PO TABS: 100.0000 mg | ORAL_TABLET | Freq: Every day | ORAL | Status: AC

## 2014-05-21 MED ORDER — POTASSIUM CHLORIDE CRYS ER 20 MEQ PO TBCR
40.0000 meq | EXTENDED_RELEASE_TABLET | Freq: Once | ORAL | Status: AC
  Administered 2014-05-21: 40 meq via ORAL
  Filled 2014-05-21: qty 2

## 2014-05-21 MED ORDER — ALPRAZOLAM 1 MG PO TABS: 1.0000 mg | ORAL_TABLET | Freq: Every day | ORAL | Status: AC

## 2014-05-21 NOTE — Progress Notes (Signed)
Reviewed discharge instructions and prescriptions with pt and family.  Answered all questions.  IV removed, pt tolerated well.

## 2014-05-21 NOTE — Progress Notes (Signed)
PROGRESS NOTE  Kirsten Montgomery MVH:[id number] DOB: 14-Dec-1960 DOA: 05/19/2014 PCP: Celedonio Savage, MD  Summary: 54 year old woman presented to the emergency department with history of fever up to 102, nausea, vomiting, profuse diarrhea for 48 hours. No recent antibiotic use. Also noted to be lethargic and hypotensive, responded to IV fluids. Presumed infectious diarrhea, treated with IV fluids and empiric ciprofloxacin.  Assessment/Plan: 1. SIRS/sepsis with hypotension, resolved. Secondary to diarrhea, dehydration, presumed viral gastroenteritis. UC and GI pathogen panel pending. 2. N/v/diarrhea, presumed infectious diarrhea. CT ab/pelvis with liquid stool throughout colon, otherwise unremarkable.resolved.  3. Dehydration. Resolved.  4. Acute encephalopathy. Resolved. Likely secondary to acute illness and hypotension. No neurologic signs or symptoms. 5. Normocytic anemia, stable, f/u as an outpatient. 6. COPD on Spiriva, stable. 7. Fibromyalgia on Xanax 2 mg QHS, Norco, Zanaflex 8. Hypothyroidism. TSH WNL. 9. History of brain aneurysm 10. Aortoiliac atherosclerosis asymptomatic.   Doing well, plan home today.  F/u GI pathogen panel, anemia panel and urine culture  F/u anemia as an outpatient  Murray Hodgkins, MD  [facility]  Pager [phone] If 7PM-7AM, please contact night-coverage at [email or web address], password [facility] 05/21/2014, 8:27 AM  LOS: 1 day   Consultants:    Procedures:    Antibiotics:  Ciprofloxacin 2/18 >> 2/22   HPI/Subjective: Feels much better, tolerating liquids, no n/v. Stools forming, diarrhea much less.  Objective: Filed Vitals:   05/20/14 2034 05/21/14 0513 05/21/14 0714 05/21/14 0818  BP:  89/44  101/59  Pulse:  77    Temp:  98.1 F (36.7 C)    TempSrc:  Oral    Resp:  18    Height:      Weight:      SpO2: 96% 96% 96%     Intake/Output Summary (Last 24 hours) at 05/21/14 0827 Last data filed at 05/20/14 1700  Gross per 24 hour  Intake    1790 ml  Output      0 ml  Net   1790 ml     Filed Weights   05/19/14 2041 05/20/14 0150  Weight: 57.607 kg (127 lb) 59.8 kg (131 lb 13.4 oz)    Exam:     Afebrile, VSS, no hypoxia General:  Appears comfortable, calm. Appears well. Cardiovascular: Regular rate and rhythm, no murmur, rub or gallop. No lower extremity edema. Respiratory: Clear to auscultation bilaterally, no wheezes, rales or rhonchi. Normal respiratory effort. Abdomen: soft, ntnd, positive bowel sounds Psychiatric: grossly normal mood and affect, speech fluent and appropriate  Data Reviewed:  BM x3  Potassium 3.4, remainder of BMP unremarkable  Hgb stable 10.1, normal WBC and plts  Admission labs notable for creatinine of 1.22, normal hepatic fxn panel, negative troponin, normal lactic acid, Hgb 10, normal TSH, negative U/A and cdiff, negative serum alcohol.  CT abdomen and pelvis with liquid stool in the colon, bowel otherwise unremarkable.   CT head unremarkable   EKG SR  Pending data:  GI pathogen panel  UC  Scheduled Meds: . atorvastatin  20 mg Oral q1800  . ciprofloxacin  400 mg Intravenous Q12H  . heparin  5,000 Units Subcutaneous 3 times per day  . liothyronine  10 mcg Oral QAC breakfast  . mometasone-formoterol  2 puff Inhalation BID  . pantoprazole  40 mg Oral Daily  . potassium chloride  40 mEq Oral Once  . tiotropium  18 mcg Inhalation Daily   Continuous Infusions:    Principal Problem:   Infectious diarrhea Active Problems:   Anxiety  Hypothyroidism   Dehydration   Diarrhea   Arterial hypotension

## 2014-05-21 NOTE — Discharge Summary (Addendum)
Physician Discharge Summary  Kirsten Montgomery VXB:[id number] DOB: 04/14/1960 DOA: 05/19/2014  PCP: Celedonio Savage, MD   Admit date: 05/19/2014 Discharge date: 05/21/2014  Recommendations for Outpatient Follow-up:  1. Urine culture and GI pathogen panel pending at time of discharge 2. Normocytic anemia, consider further evaluation as an outpatient 3. Asymptomatic aortoiliac atherosclerosis, incidental finding   Follow-up Information    Follow up with Celedonio Savage, MD In 2 weeks.   Specialty:  Family Medicine   Why:  As needed   Contact information:   [address] [place] [address] [phone]      Discharge Diagnoses:  1. SIRS/possible sepsis on admission secondary gastroenteritis 2. Nausea, vomiting, diarrhea; presumed viral gastroenteritis 3. Dehydration 4. Hypertension 5. Acute encephalopathy 6. Normocytic anemia 7. COPD 8. Asymptomatic aortoiliac atherosclerosis, incidental finding  Discharge Condition: Improved Disposition: Home  Diet recommendation: As desired  Filed Weights   05/19/14 2041 05/20/14 0150  Weight: 57.607 kg (127 lb) 59.8 kg (131 lb 13.4 oz)    History of present illness:  54 year old woman presented to the emergency department with history of fever up to 102, nausea, vomiting, profuse diarrhea for 48 hours. No recent antibiotic use. Also noted to be lethargic and hypotensive, responded to IV fluids. Presumed infectious diarrhea, treated with IV fluids and empiric ciprofloxacin.  Hospital Course:  Kirsten Montgomery was treated with ciprofloxacin and supportive care, IV fluids. Her condition rapidly improved with resolution of nausea, vomiting. Remained afebrile. Diarrhea much improved at time of discharge. Presumed viral gastroenteritis. However given her empiric treatment with ciprofloxacin, she will complete a short course of this as an outpatient. Individual issues as below.  1. SIRS/sepsis with hypotension, resolved. Secondary to diarrhea,  dehydration, presumed viral gastroenteritis. UC and GI pathogen panel pending. 2. N/v/diarrhea, presumed infectious diarrhea. CT ab/pelvis with liquid stool throughout colon, otherwise unremarkable.resolved.  3. Dehydration. Resolved.  4. Acute encephalopathy. Resolved. Likely secondary to acute illness and hypotension. No neurologic signs or symptoms. 5. Normocytic anemia, stable, f/u as an outpatient. 6. COPD on Spiriva, stable. 7. Fibromyalgia on Xanax 2 mg QHS, Norco, Zanaflex 8. Hypothyroidism. TSH WNL. 9. History of brain aneurysm 10. Aortoiliac atherosclerosis asymptomatic.  Consultants: none  Procedures: none  Antibiotics:  Ciprofloxacin 2/18 >> 2/22  Discharge Instructions  Discharge Instructions    Activity as tolerated - No restrictions    Complete by:  As directed      Diet general    Complete by:  As directed      Discharge instructions    Complete by:  As directed   Call your physician or seek immediate medical attention for pain, vomiting, diarrhea or worsening of medication.          Current Discharge Medication List    START taking these medications   Details  ciprofloxacin (CIPRO) 500 MG tablet Take 1 tablet (500 mg total) by mouth 2 (two) times daily. Qty: 5 tablet, Refills: 0      CONTINUE these medications which have CHANGED   Details  ALPRAZolam (XANAX) 1 MG tablet Take 1 tablet (1 mg total) by mouth at bedtime. For anxiety    amitriptyline (ELAVIL) 100 MG tablet Take 1 tablet (100 mg total) by mouth at bedtime.      CONTINUE these medications which have NOT CHANGED   Details  atorvastatin (LIPITOR) 20 MG tablet Take 20 mg by mouth daily.    budesonide-formoterol (SYMBICORT) 160-4.5 MCG/ACT inhaler Inhale 2 puffs into the lungs 2 (two) times daily.  diclofenac (VOLTAREN) 75 MG EC tablet Take 75 mg by mouth 2 (two) times daily.    liothyronine (CYTOMEL) 5 MCG tablet Take 10 mcg by mouth daily.     pantoprazole (PROTONIX) 40 MG tablet  Take 1 tablet (40 mg total) by mouth daily. Qty: 30 tablet, Refills: 0    tiotropium (SPIRIVA) 18 MCG inhalation capsule Place 18 mcg into inhaler and inhale daily.    tiZANidine (ZANAFLEX) 4 MG capsule Take 4-8 mg by mouth 2 (two) times daily. 1 in the morning and 2 at night    albuterol (PROVENTIL HFA;VENTOLIN HFA) 108 (90 BASE) MCG/ACT inhaler Inhale 2 puffs into the lungs every 6 (six) hours as needed for wheezing.    nitroGLYCERIN (NITROSTAT) 0.4 MG SL tablet Place 1 tablet (0.4 mg total) under the tongue every 5 (five) minutes as needed for chest pain. Qty: 20 tablet, Refills: 1      STOP taking these medications     traZODone (DESYREL) 100 MG tablet      HYDROcodone-acetaminophen (NORCO/VICODIN) 5-325 MG per tablet        Allergies  Allergen Reactions  . Codeine Itching  . Cymbalta [Duloxetine Hcl] Itching  . Darvocet [Propoxyphene N-Acetaminophen] Itching  . Demerol Itching    The results of significant diagnostics from this hospitalization (including imaging, microbiology, ancillary and laboratory) are listed below for reference.    Significant Diagnostic Studies: Ct Abdomen Pelvis Wo Contrast  05/19/2014   CLINICAL DATA:  Altered mental status, hypertension, nausea and uncontrolled bowel movements for 1 day.  EXAM: CT ABDOMEN AND PELVIS WITHOUT CONTRAST  TECHNIQUE: Multidetector CT imaging of the abdomen and pelvis was performed following the standard protocol without IV contrast.  COMPARISON:  CT abdomen and pelvis 05/28/2010.  FINDINGS: Mild dependent atelectasis is seen lung bases. No pleural or pericardial effusion.  The patient is status post cholecystectomy. A single punctate calcification is identified in the liver in the inferior right hepatic lobe. The liver is otherwise unremarkable. The spleen, adrenal glands and pancreas are unremarkable. There is no hydronephrosis on the right or left and no renal or ureteral stones are identified.  Liquid stool is seen  throughout almost the entire colon with the exception of the cecum consistent with history of diarrhea. No evidence of colitis is identified. The stomach and small bowel appear normal. No pneumatosis, portal venous gas, free air or fluid collection is seen.  The uterus has been removed. Cystic lesion along the left pelvic sidewall measuring 2.5 x 3.5 cm is unchanged. No lymphadenopathy is identified.  No focal bony abnormality is seen. The patient is status post L4-S1 fusion.  IMPRESSION: Liquid stool throughout the colon consistent with history of diarrhea. The bowel is otherwise unremarkable.  Extensive aortoiliac atherosclerosis without aneurysm.  Status post hysterectomy. Cystic lesion along left pelvic sidewall seen on the prior examination is unchanged compatible with benignity. It may represent cyst in an ovarian remnant or a peritoneal inclusion cyst.  Status post hysterectomy, cholecystectomy and lower lumbar fusion.   Electronically Signed   By: Inge Rise M.D.   On: 05/19/2014 23:14   Dg Abd 1 View  05/19/2014   CLINICAL DATA:  PT c/o lower ABD pain, nausea, diarrhea, and fever x 2 days. Pt took nyquil tonight and woke with slurred speech and confusion. Hypotension in ED currently. Hx COPD, chronic back pain, ovarian CA, smoker, cholecystectomy, hysterectomy, appendectomy  EXAM: ABDOMEN - 1 VIEW  COMPARISON:  CT, 05/28/2010  FINDINGS: Generalized decreased bowel gas.  No bowel dilation is seen to suggest obstruction or generalized adynamic ileus.  Surgical staples noted along the pelvic sidewalls. Vascular clips are upper quadrant are noted from previous cholecystectomy. No other soft tissue abnormality. No evidence of renal or ureteral stones.  Status post lower lumbar spine posterior fusion, new from the prior CT.  IMPRESSION: 1. No acute findings. No evidence of bowel obstruction or generalized adynamic ileus. 2. Relative paucity of bowel gas is consistent with gastroenteritis.   Electronically  Signed   By: Lajean Manes M.D.   On: 05/19/2014 21:31   Ct Head Wo Contrast  05/19/2014   CLINICAL DATA:  Aphasia.  Hypotension.  Altered mental status.  EXAM: CT HEAD WITHOUT CONTRAST  TECHNIQUE: Contiguous axial images were obtained from the base of the skull through the vertex without intravenous contrast.  COMPARISON:  12/29/2009  FINDINGS: There is no intracranial hemorrhage, mass or evidence of acute infarction. There is no extra-axial fluid collection. There is prior left frontal craniotomy and left MCA aneurysm clip. Gray matter and white matter appear normal. Ventricles are midline and normal. Basal cisterns are normal.  IMPRESSION: Normal brain.  Prior craniotomy and left MCA aneurysm clip.   Electronically Signed   By: Andreas Newport M.D.   On: 05/19/2014 23:06   Dg Chest Port 1 View  05/19/2014   CLINICAL DATA:  Lower abdominal pain, nausea, diarrhea and fever for 2 days, awoke with slurred speech and confusion after Nyquil, hypotension in ED, COPD, ovarian cancer, smoker  EXAM: PORTABLE CHEST - 1 VIEW  COMPARISON:  Portable exam 2105 hr compared to 08/25/2012  FINDINGS: Normal heart size, mediastinal contours, and pulmonary vascularity.  Mild peribronchial thickening.  No pulmonary infiltrate, pleural effusion, or pneumothorax.  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  Bones otherwise unremarkable.  Multiple EKG leads project over chest.  IMPRESSION: Mild bronchitic changes without infiltrate.   Electronically Signed   By: Lavonia Dana M.D.   On: 05/19/2014 21:29    Microbiology: Recent Results (from the past 240 hour(s))  Clostridium Difficile by PCR     Status: None   Collection Time: 05/19/14 11:50 PM  Result Value Ref Range Status   C difficile by pcr NEGATIVE NEGATIVE Final  MRSA PCR Screening     Status: None   Collection Time: 05/20/14  1:42 AM  Result Value Ref Range Status   MRSA by PCR NEGATIVE NEGATIVE Final    Comment:        The GeneXpert MRSA Assay  (FDA approved for NASAL specimens only), is one component of a comprehensive MRSA colonization surveillance program. It is not intended to diagnose MRSA infection nor to guide or monitor treatment for MRSA infections.      Labs: Basic Metabolic Panel:  Recent Labs Lab 05/19/14 2118 05/20/14 0959 05/21/14 0631  NA 134* 137 137  K 3.8 3.5 3.4*  CL 102 109 107  CO2 25 25 27   GLUCOSE 149* 125* 98  BUN 21 14 7   CREATININE 1.22* 0.85 0.80  CALCIUM 8.1* 8.1* 8.3*   Liver Function Tests:  Recent Labs Lab 05/19/14 2118  AST 16  ALT 18  ALKPHOS 74  BILITOT 0.6  PROT 6.1  ALBUMIN 3.1*    Recent Labs Lab 05/19/14 2118  LIPASE 20   CBC:  Recent Labs Lab 05/19/14 2118 05/20/14 0959 05/21/14 0631  WBC 7.4 5.2 6.3  NEUTROABS 5.4  --   --   HGB 10.1* 10.5* 10.1*  HCT 30.6* 32.4*  30.9*  MCV 86.9 87.3 87.0  PLT 226 203 217   Cardiac Enzymes:  Recent Labs Lab 05/19/14 2118  TROPONINI <0.03    Principal Problem:   Infectious diarrhea Active Problems:   Anxiety   Hypothyroidism   Dehydration   Diarrhea   Arterial hypotension   Normocytic anemia   Acute encephalopathy   Nausea vomiting and diarrhea   SIRS (systemic inflammatory response syndrome)   Time coordinating discharge: 35 minutes  Signed:  Murray Hodgkins, MD [facility] 05/21/2014, 9:30 AM

## 2014-05-24 LAB — GI PATHOGEN PANEL BY PCR, STOOL
C difficile toxin A/B: NOT DETECTED
Campylobacter by PCR: NOT DETECTED
Cryptosporidium by PCR: NOT DETECTED
E COLI (ETEC) LT/ST: NOT DETECTED
E COLI (STEC): NOT DETECTED
E COLI 0157 BY PCR: NOT DETECTED
G lamblia by PCR: NOT DETECTED
Norovirus GI/GII: NOT DETECTED
Rotavirus A by PCR: NOT DETECTED
SHIGELLA BY PCR: NOT DETECTED

## 2014-05-27 NOTE — Progress Notes (Signed)
UR chart review completed.
